# Patient Record
Sex: Female | Born: 1965 | Race: Black or African American | Hispanic: No | Smoking: Former smoker
Health system: Southern US, Community
[De-identification: ages and names within clinical notes are randomized; demographics above are authoritative.]

## PROBLEM LIST (undated history)

## (undated) DIAGNOSIS — F32A Depression, unspecified: Secondary | ICD-10-CM

## (undated) DIAGNOSIS — T7840XA Allergy, unspecified, initial encounter: Secondary | ICD-10-CM

## (undated) DIAGNOSIS — Z01419 Encounter for gynecological examination (general) (routine) without abnormal findings: Secondary | ICD-10-CM

## (undated) DIAGNOSIS — I10 Essential (primary) hypertension: Secondary | ICD-10-CM

## (undated) DIAGNOSIS — F329 Major depressive disorder, single episode, unspecified: Secondary | ICD-10-CM

## (undated) HISTORY — DX: Allergy, unspecified, initial encounter: T78.40XA

## (undated) HISTORY — DX: Depression, unspecified: F32.A

## (undated) HISTORY — PX: CARPAL TUNNEL RELEASE: SHX101

## (undated) HISTORY — PX: ABDOMINAL HYSTERECTOMY: SHX81

## (undated) HISTORY — DX: Essential (primary) hypertension: I10

## (undated) HISTORY — PX: CHOLECYSTECTOMY: SHX55

## (undated) HISTORY — DX: Encounter for gynecological examination (general) (routine) without abnormal findings: Z01.419

## (undated) HISTORY — PX: CERVICAL DISC SURGERY: SHX588

## (undated) HISTORY — DX: Major depressive disorder, single episode, unspecified: F32.9

---

## 1998-05-05 ENCOUNTER — Other Ambulatory Visit: Admission: RE | Admit: 1998-05-05 | Discharge: 1998-05-05 | Payer: Self-pay | Admitting: Family Medicine

## 1998-06-29 ENCOUNTER — Other Ambulatory Visit: Admission: RE | Admit: 1998-06-29 | Discharge: 1998-06-29 | Payer: Self-pay | Admitting: Obstetrics and Gynecology

## 1998-11-18 ENCOUNTER — Other Ambulatory Visit: Admission: RE | Admit: 1998-11-18 | Discharge: 1998-11-18 | Payer: Self-pay | Admitting: Obstetrics and Gynecology

## 1999-02-01 ENCOUNTER — Encounter: Payer: Self-pay | Admitting: Gastroenterology

## 1999-02-01 ENCOUNTER — Encounter: Admission: RE | Admit: 1999-02-01 | Discharge: 1999-02-01 | Payer: Self-pay | Admitting: Gastroenterology

## 1999-02-17 ENCOUNTER — Ambulatory Visit (HOSPITAL_COMMUNITY): Admission: RE | Admit: 1999-02-17 | Discharge: 1999-02-18 | Payer: Self-pay | Admitting: General Surgery

## 1999-02-17 ENCOUNTER — Encounter (INDEPENDENT_AMBULATORY_CARE_PROVIDER_SITE_OTHER): Payer: Self-pay

## 1999-06-10 ENCOUNTER — Other Ambulatory Visit: Admission: RE | Admit: 1999-06-10 | Discharge: 1999-06-10 | Payer: Self-pay | Admitting: Obstetrics and Gynecology

## 2000-03-28 ENCOUNTER — Encounter (INDEPENDENT_AMBULATORY_CARE_PROVIDER_SITE_OTHER): Payer: Self-pay | Admitting: Specialist

## 2000-03-28 ENCOUNTER — Ambulatory Visit (HOSPITAL_BASED_OUTPATIENT_CLINIC_OR_DEPARTMENT_OTHER): Admission: RE | Admit: 2000-03-28 | Discharge: 2000-03-28 | Payer: Self-pay | Admitting: Orthopedic Surgery

## 2000-06-16 ENCOUNTER — Other Ambulatory Visit: Admission: RE | Admit: 2000-06-16 | Discharge: 2000-06-16 | Payer: Self-pay | Admitting: Obstetrics and Gynecology

## 2000-07-28 ENCOUNTER — Other Ambulatory Visit: Admission: RE | Admit: 2000-07-28 | Discharge: 2000-07-28 | Payer: Self-pay | Admitting: Obstetrics and Gynecology

## 2000-07-28 ENCOUNTER — Encounter (INDEPENDENT_AMBULATORY_CARE_PROVIDER_SITE_OTHER): Payer: Self-pay

## 2000-08-24 ENCOUNTER — Ambulatory Visit (HOSPITAL_COMMUNITY): Admission: RE | Admit: 2000-08-24 | Discharge: 2000-08-24 | Payer: Self-pay | Admitting: Obstetrics and Gynecology

## 2000-08-24 ENCOUNTER — Encounter (INDEPENDENT_AMBULATORY_CARE_PROVIDER_SITE_OTHER): Payer: Self-pay

## 2000-11-30 ENCOUNTER — Encounter: Payer: Self-pay | Admitting: Sports Medicine

## 2000-11-30 ENCOUNTER — Encounter: Admission: RE | Admit: 2000-11-30 | Discharge: 2000-11-30 | Payer: Self-pay | Admitting: Sports Medicine

## 2000-12-14 ENCOUNTER — Other Ambulatory Visit: Admission: RE | Admit: 2000-12-14 | Discharge: 2000-12-14 | Payer: Self-pay | Admitting: Obstetrics and Gynecology

## 2001-10-26 ENCOUNTER — Other Ambulatory Visit: Admission: RE | Admit: 2001-10-26 | Discharge: 2001-10-26 | Payer: Self-pay | Admitting: Obstetrics and Gynecology

## 2002-12-06 ENCOUNTER — Other Ambulatory Visit: Admission: RE | Admit: 2002-12-06 | Discharge: 2002-12-06 | Payer: Self-pay | Admitting: Obstetrics and Gynecology

## 2002-12-27 ENCOUNTER — Encounter (INDEPENDENT_AMBULATORY_CARE_PROVIDER_SITE_OTHER): Payer: Self-pay | Admitting: *Deleted

## 2002-12-27 ENCOUNTER — Inpatient Hospital Stay (HOSPITAL_COMMUNITY): Admission: RE | Admit: 2002-12-27 | Discharge: 2002-12-30 | Payer: Self-pay | Admitting: Obstetrics and Gynecology

## 2003-03-04 ENCOUNTER — Emergency Department (HOSPITAL_COMMUNITY): Admission: EM | Admit: 2003-03-04 | Discharge: 2003-03-04 | Payer: Self-pay | Admitting: Emergency Medicine

## 2004-05-31 ENCOUNTER — Ambulatory Visit: Payer: Self-pay | Admitting: Family Medicine

## 2004-06-14 ENCOUNTER — Ambulatory Visit: Payer: Self-pay | Admitting: Family Medicine

## 2004-09-10 ENCOUNTER — Ambulatory Visit: Payer: Self-pay | Admitting: Family Medicine

## 2004-11-15 ENCOUNTER — Ambulatory Visit: Payer: Self-pay | Admitting: Family Medicine

## 2004-11-23 ENCOUNTER — Ambulatory Visit: Payer: Self-pay | Admitting: Family Medicine

## 2004-12-02 ENCOUNTER — Ambulatory Visit: Payer: Self-pay | Admitting: Family Medicine

## 2004-12-03 ENCOUNTER — Ambulatory Visit: Payer: Self-pay | Admitting: Internal Medicine

## 2004-12-10 ENCOUNTER — Ambulatory Visit: Payer: Self-pay | Admitting: Internal Medicine

## 2005-04-11 ENCOUNTER — Ambulatory Visit: Payer: Self-pay | Admitting: Family Medicine

## 2005-04-13 ENCOUNTER — Other Ambulatory Visit: Admission: RE | Admit: 2005-04-13 | Discharge: 2005-04-13 | Payer: Self-pay | Admitting: Obstetrics and Gynecology

## 2005-04-18 ENCOUNTER — Ambulatory Visit: Payer: Self-pay | Admitting: Family Medicine

## 2005-04-22 ENCOUNTER — Ambulatory Visit: Payer: Self-pay

## 2005-07-04 ENCOUNTER — Ambulatory Visit: Payer: Self-pay | Admitting: Family Medicine

## 2005-07-08 ENCOUNTER — Ambulatory Visit: Payer: Self-pay | Admitting: Family Medicine

## 2006-05-24 ENCOUNTER — Ambulatory Visit: Payer: Self-pay | Admitting: Family Medicine

## 2006-05-24 LAB — CONVERTED CEMR LAB
ALT: 15 units/L (ref 0–40)
AST: 18 units/L (ref 0–37)
Albumin: 4 g/dL (ref 3.5–5.2)
Alkaline Phosphatase: 95 units/L (ref 39–117)
BUN: 4 mg/dL — ABNORMAL LOW (ref 6–23)
Basophils Absolute: 0.1 10*3/uL (ref 0.0–0.1)
Basophils Relative: 1.2 % — ABNORMAL HIGH (ref 0.0–1.0)
Bilirubin, Direct: 0.1 mg/dL (ref 0.0–0.3)
CO2: 31 meq/L (ref 19–32)
Calcium: 9.6 mg/dL (ref 8.4–10.5)
Chloride: 104 meq/L (ref 96–112)
Cholesterol: 180 mg/dL (ref 0–200)
Creatinine, Ser: 0.7 mg/dL (ref 0.4–1.2)
Eosinophils Absolute: 0.1 10*3/uL (ref 0.0–0.6)
Eosinophils Relative: 1.8 % (ref 0.0–5.0)
GFR calc Af Amer: 119 mL/min
GFR calc non Af Amer: 99 mL/min
Glucose, Bld: 95 mg/dL (ref 70–99)
HCT: 38.4 % (ref 36.0–46.0)
HDL: 59.6 mg/dL (ref >39.0)
Hemoglobin: 13.6 g/dL (ref 12.0–15.0)
LDL Cholesterol: 102 mg/dL — ABNORMAL HIGH (ref 0–99)
Lymphocytes Relative: 33.9 % (ref 12.0–46.0)
MCHC: 35.5 g/dL (ref 30.0–36.0)
MCV: 96.8 fL (ref 78.0–100.0)
Monocytes Absolute: 0.3 10*3/uL (ref 0.2–0.7)
Monocytes Relative: 6.2 % (ref 3.0–11.0)
Neutro Abs: 2.9 10*3/uL (ref 1.4–7.7)
Neutrophils Relative %: 56.9 % (ref 43.0–77.0)
Platelets: 340 10*3/uL (ref 150–400)
Potassium: 3.9 meq/L (ref 3.5–5.1)
RBC: 3.96 M/uL (ref 3.87–5.11)
RDW: 12.5 % (ref 11.5–14.6)
Sodium: 142 meq/L (ref 135–145)
TSH: 2.02 microintl units/mL (ref 0.35–5.50)
Total Bilirubin: 0.7 mg/dL (ref 0.3–1.2)
Total CHOL/HDL Ratio: 3
Total Protein: 7 g/dL (ref 6.0–8.3)
Triglycerides: 93 mg/dL (ref 0–149)
VLDL: 19 mg/dL (ref 0–40)
WBC: 5.1 10*3/uL (ref 4.5–10.5)

## 2006-05-31 ENCOUNTER — Ambulatory Visit: Payer: Self-pay | Admitting: Family Medicine

## 2006-08-22 ENCOUNTER — Ambulatory Visit: Payer: Self-pay | Admitting: Family Medicine

## 2007-01-19 ENCOUNTER — Ambulatory Visit: Payer: Self-pay | Admitting: Family Medicine

## 2007-01-19 DIAGNOSIS — I10 Essential (primary) hypertension: Secondary | ICD-10-CM | POA: Insufficient documentation

## 2007-01-19 DIAGNOSIS — F329 Major depressive disorder, single episode, unspecified: Secondary | ICD-10-CM | POA: Insufficient documentation

## 2007-01-19 DIAGNOSIS — J309 Allergic rhinitis, unspecified: Secondary | ICD-10-CM | POA: Insufficient documentation

## 2007-01-19 DIAGNOSIS — Z9189 Other specified personal risk factors, not elsewhere classified: Secondary | ICD-10-CM | POA: Insufficient documentation

## 2007-01-19 DIAGNOSIS — J45909 Unspecified asthma, uncomplicated: Secondary | ICD-10-CM | POA: Insufficient documentation

## 2007-01-19 DIAGNOSIS — R599 Enlarged lymph nodes, unspecified: Secondary | ICD-10-CM | POA: Insufficient documentation

## 2007-01-19 DIAGNOSIS — F419 Anxiety disorder, unspecified: Secondary | ICD-10-CM | POA: Insufficient documentation

## 2007-05-02 ENCOUNTER — Telehealth: Payer: Self-pay | Admitting: Family Medicine

## 2008-03-10 ENCOUNTER — Encounter: Payer: Self-pay | Admitting: Family Medicine

## 2008-03-25 ENCOUNTER — Ambulatory Visit (HOSPITAL_COMMUNITY): Admission: RE | Admit: 2008-03-25 | Discharge: 2008-03-26 | Payer: Self-pay | Admitting: Neurological Surgery

## 2008-05-09 ENCOUNTER — Encounter: Payer: Self-pay | Admitting: Family Medicine

## 2008-08-04 ENCOUNTER — Telehealth: Payer: Self-pay | Admitting: Family Medicine

## 2008-08-07 ENCOUNTER — Ambulatory Visit: Payer: Self-pay | Admitting: Family Medicine

## 2008-08-07 LAB — CONVERTED CEMR LAB
Bilirubin Urine: NEGATIVE
Blood in Urine, dipstick: NEGATIVE
Glucose, Urine, Semiquant: NEGATIVE
Ketones, urine, test strip: NEGATIVE
Nitrite: NEGATIVE
Protein, U semiquant: NEGATIVE
Specific Gravity, Urine: 1.015
Urobilinogen, UA: 0.2
WBC Urine, dipstick: NEGATIVE
pH: 5.5

## 2008-08-08 ENCOUNTER — Encounter: Payer: Self-pay | Admitting: Family Medicine

## 2008-08-08 LAB — CONVERTED CEMR LAB
ALT: 13 units/L (ref 0–35)
AST: 18 units/L (ref 0–37)
Albumin: 4.1 g/dL (ref 3.5–5.2)
Alkaline Phosphatase: 95 units/L (ref 39–117)
BUN: 6 mg/dL (ref 6–23)
Basophils Absolute: 0 10*3/uL (ref 0.0–0.1)
Basophils Relative: 0.4 % (ref 0.0–3.0)
Bilirubin, Direct: 0 mg/dL (ref 0.0–0.3)
CO2: 29 meq/L (ref 19–32)
Calcium: 9 mg/dL (ref 8.4–10.5)
Chloride: 110 meq/L (ref 96–112)
Cholesterol: 200 mg/dL (ref 0–200)
Creatinine, Ser: 0.7 mg/dL (ref 0.4–1.2)
Eosinophils Absolute: 0.1 10*3/uL (ref 0.0–0.7)
Eosinophils Relative: 1.5 % (ref 0.0–5.0)
GFR calc non Af Amer: 117.7 mL/min (ref >60)
Glucose, Bld: 83 mg/dL (ref 70–99)
HCT: 41.6 % (ref 36.0–46.0)
HDL: 59.1 mg/dL (ref >39.00)
Hemoglobin: 14.2 g/dL (ref 12.0–15.0)
LDL Cholesterol: 125 mg/dL — ABNORMAL HIGH (ref 0–99)
Lymphocytes Relative: 36.7 % (ref 12.0–46.0)
Lymphs Abs: 1.5 10*3/uL (ref 0.7–4.0)
MCHC: 34.2 g/dL (ref 30.0–36.0)
MCV: 100.4 fL — ABNORMAL HIGH (ref 78.0–100.0)
Monocytes Absolute: 0.3 10*3/uL (ref 0.1–1.0)
Monocytes Relative: 7 % (ref 3.0–12.0)
Neutro Abs: 2.3 10*3/uL (ref 1.4–7.7)
Neutrophils Relative %: 54.4 % (ref 43.0–77.0)
Platelets: 289 10*3/uL (ref 150.0–400.0)
Potassium: 4.3 meq/L (ref 3.5–5.1)
RBC: 4.14 M/uL (ref 3.87–5.11)
RDW: 12.7 % (ref 11.5–14.6)
Sodium: 142 meq/L (ref 135–145)
TSH: 0.97 microintl units/mL (ref 0.35–5.50)
Total Bilirubin: 0.6 mg/dL (ref 0.3–1.2)
Total CHOL/HDL Ratio: 3
Total Protein: 7.3 g/dL (ref 6.0–8.3)
Triglycerides: 81 mg/dL (ref 0.0–149.0)
VLDL: 16.2 mg/dL (ref 0.0–40.0)
WBC: 4.2 10*3/uL — ABNORMAL LOW (ref 4.5–10.5)

## 2008-08-29 ENCOUNTER — Ambulatory Visit: Payer: Self-pay | Admitting: Family Medicine

## 2008-09-29 ENCOUNTER — Ambulatory Visit: Payer: Self-pay | Admitting: Family Medicine

## 2008-09-29 DIAGNOSIS — E669 Obesity, unspecified: Secondary | ICD-10-CM | POA: Insufficient documentation

## 2008-09-29 DIAGNOSIS — M549 Dorsalgia, unspecified: Secondary | ICD-10-CM | POA: Insufficient documentation

## 2008-09-29 LAB — CONVERTED CEMR LAB
Bilirubin Urine: NEGATIVE
Blood in Urine, dipstick: NEGATIVE
Glucose, Urine, Semiquant: NEGATIVE
Ketones, urine, test strip: NEGATIVE
Nitrite: NEGATIVE
Specific Gravity, Urine: <1.005
Urobilinogen, UA: 0.2
WBC Urine, dipstick: NEGATIVE
pH: 6

## 2008-11-17 ENCOUNTER — Telehealth: Payer: Self-pay | Admitting: Family Medicine

## 2009-03-10 ENCOUNTER — Telehealth: Payer: Self-pay | Admitting: Family Medicine

## 2009-04-18 ENCOUNTER — Emergency Department (HOSPITAL_COMMUNITY): Admission: EM | Admit: 2009-04-18 | Discharge: 2009-04-18 | Payer: Self-pay | Admitting: Emergency Medicine

## 2009-07-06 ENCOUNTER — Encounter: Payer: Self-pay | Admitting: Family Medicine

## 2009-09-22 ENCOUNTER — Ambulatory Visit: Payer: Self-pay | Admitting: Family Medicine

## 2009-09-22 LAB — CONVERTED CEMR LAB
Bilirubin Urine: NEGATIVE
Blood in Urine, dipstick: NEGATIVE
Glucose, Urine, Semiquant: NEGATIVE
Ketones, urine, test strip: NEGATIVE
Nitrite: NEGATIVE
Protein, U semiquant: NEGATIVE
Specific Gravity, Urine: 1.02
Urobilinogen, UA: 0.2
WBC Urine, dipstick: NEGATIVE
pH: 7

## 2009-09-23 LAB — CONVERTED CEMR LAB
ALT: 16 units/L (ref 0–35)
AST: 19 units/L (ref 0–37)
Albumin: 4.2 g/dL (ref 3.5–5.2)
Alkaline Phosphatase: 103 units/L (ref 39–117)
BUN: 8 mg/dL (ref 6–23)
Basophils Absolute: 0 10*3/uL (ref 0.0–0.1)
Basophils Relative: 0.5 % (ref 0.0–3.0)
Bilirubin, Direct: 0.1 mg/dL (ref 0.0–0.3)
CO2: 30 meq/L (ref 19–32)
Calcium: 9.2 mg/dL (ref 8.4–10.5)
Chloride: 106 meq/L (ref 96–112)
Cholesterol: 186 mg/dL (ref 0–200)
Creatinine, Ser: 0.7 mg/dL (ref 0.4–1.2)
Eosinophils Absolute: 0.1 10*3/uL (ref 0.0–0.7)
Eosinophils Relative: 1.5 % (ref 0.0–5.0)
GFR calc non Af Amer: 123.15 mL/min (ref >60)
Glucose, Bld: 82 mg/dL (ref 70–99)
HCT: 40 % (ref 36.0–46.0)
HDL: 46.6 mg/dL (ref >39.00)
Hemoglobin: 13.8 g/dL (ref 12.0–15.0)
LDL Cholesterol: 111 mg/dL — ABNORMAL HIGH (ref 0–99)
Lymphocytes Relative: 40.9 % (ref 12.0–46.0)
Lymphs Abs: 1.8 10*3/uL (ref 0.7–4.0)
MCHC: 34.4 g/dL (ref 30.0–36.0)
MCV: 99.1 fL (ref 78.0–100.0)
Monocytes Absolute: 0.3 10*3/uL (ref 0.1–1.0)
Monocytes Relative: 6.4 % (ref 3.0–12.0)
Neutro Abs: 2.2 10*3/uL (ref 1.4–7.7)
Neutrophils Relative %: 50.7 % (ref 43.0–77.0)
Platelets: 313 10*3/uL (ref 150.0–400.0)
Potassium: 4.4 meq/L (ref 3.5–5.1)
RBC: 4.04 M/uL (ref 3.87–5.11)
RDW: 13.4 % (ref 11.5–14.6)
Sodium: 139 meq/L (ref 135–145)
TSH: 1.4 microintl units/mL (ref 0.35–5.50)
Total Bilirubin: 0.3 mg/dL (ref 0.3–1.2)
Total CHOL/HDL Ratio: 4
Total Protein: 7.1 g/dL (ref 6.0–8.3)
Triglycerides: 143 mg/dL (ref 0.0–149.0)
VLDL: 28.6 mg/dL (ref 0.0–40.0)
WBC: 4.3 10*3/uL — ABNORMAL LOW (ref 4.5–10.5)

## 2009-10-01 ENCOUNTER — Ambulatory Visit: Payer: Self-pay | Admitting: Family Medicine

## 2009-10-19 ENCOUNTER — Telehealth: Payer: Self-pay | Admitting: Family Medicine

## 2010-04-01 NOTE — Assessment & Plan Note (Signed)
Summary: knot behing ear/mhf  Medications Added LOTREL 10-20 MG CAPS (AMLODIPINE BESY-BENAZEPRIL HCL) 1 by mouth once daily HYDROXYZINE HCL 25 MG TABS (HYDROXYZINE HCL) 1 by mouth once daily PROVENTIL 90 MCG/ACT  AERS (ALBUTEROL) as needed      Allergies Added: ! TYLOX  Vital Signs:  Patient Profile:   45 Years Old Female Weight:      229 pounds Temp:     98.6 degrees F oral Pulse rate:   96 / minute Pulse rhythm:   regular BP sitting:   112 / 74  (left arm) Cuff size:   large  Vitals Entered By: Alfred Levins, CMA (January 19, 2007 10:07 AM)                 Chief Complaint:  knot behind lt ear and sore.  History of Present Illness: 5 days ago a tender lump came3 up just below left earlobe. Has had some irritation behind earlobes lately. Today the lump is much smaller.  Current Allergies: ! TYLOX  Past Medical History:    Reviewed history and no changes required:       Asthma per Dr. Corinda Gubler       Allergic rhinitis       Hypertension       Depression  Past Surgical History:    Carpal tunnel release    Cholecystectomy    Hysterectomy   Family History:    Family History Diabetes 1st degree relative    Family History High cholesterol    Family History Hypertension    Family History Uterine cancer  Social History:    Single    Current Smoker    Alcohol use-yes    Drug use-no   Risk Factors:  Tobacco use:  current Drug use:  no Alcohol use:  yes   Review of Systems      See HPI   Physical Exam  General:     Well-developed,well-nourished,in no acute distress; alert,appropriate and cooperative throughout examination Skin:     slight erythema and scaling behind earlobes Cervical Nodes:     tiny tender subcutaneous lump below left ear    Impression & Recommendations:  Problem # 1:  ENLARGEMENT OF LYMPH NODES (ICD-785.6) appears to be a single node reacting to the eczema behind the earlobe.  Complete Medication List: 1)  Lotrel 10-20 Mg  Caps (Amlodipine besy-benazepril hcl) .Marland Kitchen.. 1 by mouth once daily 2)  Hydroxyzine Hcl 25 Mg Tabs (Hydroxyzine hcl) .Marland Kitchen.. 1 by mouth once daily 3)  Proventil 90 Mcg/act Aers (Albuterol) .... As needed   Patient Instructions: 1)  Should be temporary and resolve soon. Try Neosporin behind earlobes. 2)  Please schedule a follow-up appointment as needed.    ]

## 2010-04-01 NOTE — Letter (Signed)
Summary: [facility]  [facility]   Imported By: Maryln Gottron 05/28/2008 15:08:22  _____________________________________________________________________  External Attachment:    Type:   Image     Comment:   External Document

## 2010-04-01 NOTE — Progress Notes (Signed)
Summary: RF  Phone Note Call from Patient Call back at [phone]   Caller: Patient-LIVE CALL Reason for Call: Refill Medication Summary of Call: RF LOTREL AT [facility] AT [phone]. HER CPX IS ON 08-29-2008 Initial call taken by: Warnell Forester,  August 04, 2008 8:07 AM  Follow-up for Phone Call        Phone Call Completed, Rx Called In Follow-up by: Alfred Levins, CMA,  August 04, 2008 8:23 AM      Prescriptions: LOTREL 10-20 MG CAPS (AMLODIPINE BESY-BENAZEPRIL HCL) 1 by mouth once daily  #30 x 0   Entered by:   Alfred Levins, CMA   Authorized by:   Nelwyn Salisbury MD   Signed by:   Alfred Levins, CMA on 08/04/2008   Method used:   Electronically to        [facility]  [address] (retail)       [address]       [place], [place]  [address]       Ph: [phone]       Fax: [phone]   RxID:   [id number]

## 2010-04-01 NOTE — Progress Notes (Signed)
Summary: refill phentermine  Phone Note From Pharmacy   Caller: [facility]  [address] Call For: Sarely Stracener  Summary of Call: refill phentermine 37.5mg  1 by mouth once daily Initial call taken by: Alfred Levins, CMA,  March 10, 2009 11:20 AM  Follow-up for Phone Call        call in #30 with 5 rf Follow-up by: Nelwyn Salisbury MD,  March 10, 2009 11:52 AM  Additional Follow-up for Phone Call Additional follow up Details #1::        Phone call completed, Pharmacist called Additional Follow-up by: Alfred Levins, CMA,  March 10, 2009 12:09 PM    Prescriptions: PHENTERMINE HCL 37.5 MG CAPS (PHENTERMINE HCL) once daily  #30 x 5   Entered by:   Alfred Levins, CMA   Authorized by:   Nelwyn Salisbury MD   Signed by:   Alfred Levins, CMA on 03/10/2009   Method used:   Telephoned to ...       [facility]  [address] [address] (retail)       [address]       [place], [place]  [address]       Ph: [phone]       Fax: [phone]   RxID:   [id number]

## 2010-04-01 NOTE — Letter (Signed)
Summary: Lipid Letter  [facility]  [address] [place], [place] [address]   Phone: [phone]  Fax: [phone]    08/08/2008  Vanessa Hawkins [address] [place], [place]  [address]  Dear Suzette Battiest:  We have carefully reviewed your last lipid profile from 08/07/2008 and the results are noted below with a summary of recommendations for lipid management.    Cholesterol:       200     Goal: <   HDL "good" Cholesterol:   62.95     Goal: >   LDL "bad" Cholesterol:   125     Goal: <   Triglycerides:       81.0     Goal: <    Labs are normal.    TLC Diet (Therapeutic Lifestyle Change): Saturated Fats & Transfatty acids should be kept < 7% of total calories ***Reduce Saturated Fats Polyunstaurated Fat can be up to 10% of total calories Monounsaturated Fat Fat can be up to 20% of total calories Total Fat should be no greater than 25-35% of total calories Carbohydrates should be 50-60% of total calories Protein should be approximately 15% of total calories Fiber should be at least 20-30 grams a day ***Increased fiber may help lower LDL Total Cholesterol should be < 200mg /day Consider adding plant stanol/sterols to diet (example: Benacol spread) ***A higher intake of unsaturated fat may reduce Triglycerides and Increase HDL    Adjunctive Measures (may lower LIPIDS and reduce risk of Heart Attack) include: Aerobic Exercise (20-30 minutes 3-4 times a week) Limit Alcohol Consumption Weight Reduction Aspirin 75-81 mg a day by mouth (if not allergic or contraindicated) Dietary Fiber 20-30 grams a day by mouth     Current Medications: 1)    Lotrel 10-20 Mg Caps (Amlodipine besy-benazepril hcl) .Marland Kitchen.. 1 by mouth once daily 2)    Hydroxyzine Hcl 25 Mg Tabs (Hydroxyzine hcl) .Marland Kitchen.. 1 by mouth once daily 3)    Proventil 90 Mcg/act  Aers (Albuterol) .... As needed 4)    Chantix Starting Month Pak 0.5 Mg X 11 & 1 Mg X 42  Misc (Varenicline tartrate) .... 0.5mg  by mouth once  daily for 3 days, then twice daily for 4 days and then 1mg  by mouth 2 times daily  If you have any questions, please call. We appreciate being able to work with you.   Sincerely,    [place] at [facility] MD

## 2010-04-01 NOTE — Letter (Signed)
Summary: [facility]  [facility]   Imported By: Maryln Gottron 04/04/2008 15:23:25  _____________________________________________________________________  External Attachment:    Type:   Image     Comment:   External Document

## 2010-04-01 NOTE — Letter (Signed)
Summary: [facility]  [facility]   Imported By: Maryln Gottron 07/21/2009 15:02:40  _____________________________________________________________________  External Attachment:    Type:   Image     Comment:   External Document

## 2010-04-01 NOTE — Assessment & Plan Note (Signed)
Summary: CPX/NO PAP/NJR PT RSC/NJR   Vital Signs:  Patient profile:   45 year old female Height:      68 inches Weight:      224 pounds BMI:     34.18 Temp:     98.0 degrees F oral Pulse rate:   101 / minute BP sitting:   124 / 82  (left arm) Cuff size:   large  Vitals Entered By: Alfred Levins, CMA (August 29, 2008 8:55 AM) CC: cpx, no pap   History of Present Illness: 45 yr old female for cpx. Doing well except she would like some help in losing weight. is exercising but would like to try an appetite suppressant also. Still has some soreness in the neck after her disc surgery, but this is getting better.   Allergies: 1)  ! Tylox  Past History:  Past Medical History: Asthma per Dr. Corinda Gubler Allergic rhinitis Hypertension Depression sees Dr. Duane Lope for GYN exams  Past Surgical History: Carpal tunnel release Cholecystectomy Hysterectomy C4-5 disc surgery 1-10 per Dr. Danielle Dess  Family History: Reviewed history from 01/19/2007 and no changes required. Family History Diabetes 1st degree relative Family History High cholesterol Family History Hypertension Family History Uterine cancer  Social History: Reviewed history from 01/19/2007 and no changes required. Single Current Smoker Alcohol use-yes Drug use-no  Review of Systems  The patient denies anorexia, fever, weight loss, weight gain, vision loss, decreased hearing, hoarseness, chest pain, syncope, dyspnea on exertion, peripheral edema, prolonged cough, headaches, hemoptysis, abdominal pain, melena, hematochezia, severe indigestion/heartburn, hematuria, incontinence, genital sores, muscle weakness, suspicious skin lesions, transient blindness, difficulty walking, depression, unusual weight change, abnormal bleeding, enlarged lymph nodes, angioedema, breast masses, and testicular masses.    Physical Exam  General:  overweight-appearing.   Head:  Normocephalic and atraumatic without obvious abnormalities. No  apparent alopecia or balding. Eyes:  No corneal or conjunctival inflammation noted. EOMI. Perrla. Funduscopic exam benign, without hemorrhages, exudates or papilledema. Vision grossly normal. Ears:  External ear exam shows no significant lesions or deformities.  Otoscopic examination reveals clear canals, tympanic membranes are intact bilaterally without bulging, retraction, inflammation or discharge. Hearing is grossly normal bilaterally. Nose:  External nasal examination shows no deformity or inflammation. Nasal mucosa are pink and moist without lesions or exudates. Mouth:  Oral mucosa and oropharynx without lesions or exudates.  Teeth in good repair. Neck:  No deformities, masses, or tenderness noted. Chest Wall:  No deformities, masses, or tenderness noted. Lungs:  Normal respiratory effort, chest expands symmetrically. Lungs are clear to auscultation, no crackles or wheezes. Heart:  Normal rate and regular rhythm. S1 and S2 normal without gallop, murmur, click, rub or other extra sounds. Abdomen:  Bowel sounds positive,abdomen soft and non-tender without masses, organomegaly or hernias noted. Msk:  No deformity or scoliosis noted of thoracic or lumbar spine.   Pulses:  R and L carotid,radial,femoral,dorsalis pedis and posterior tibial pulses are full and equal bilaterally Extremities:  No clubbing, cyanosis, edema, or deformity noted with normal full range of motion of all joints.   Neurologic:  No cranial nerve deficits noted. Station and gait are normal. Plantar reflexes are down-going bilaterally. DTRs are symmetrical throughout. Sensory, motor and coordinative functions appear intact. Skin:  Intact without suspicious lesions or rashes Cervical Nodes:  No lymphadenopathy noted Axillary Nodes:  No palpable lymphadenopathy Inguinal Nodes:  No significant adenopathy Psych:  Cognition and judgment appear intact. Alert and cooperative with normal attention span and concentration. No apparent  delusions,  illusions, hallucinations   Impression & Recommendations:  Problem # 1:  HEALTH MAINTENANCE EXAM (ICD-V70.0)  Complete Medication List: 1)  Lotrel 10-20 Mg Caps (Amlodipine besy-benazepril hcl) .Marland Kitchen.. 1 by mouth once daily 2)  Hydroxyzine Hcl 25 Mg Tabs (Hydroxyzine hcl) .Marland Kitchen.. 1 by mouth once daily 3)  Proventil Hfa 108 (90 Base) Mcg/act Aers (Albuterol sulfate) .... 2 puffs q  4 hours as needed 4)  Phentermine Hcl 37.5 Mg Caps (Phentermine hcl) .... Once daily  Patient Instructions: 1)  try phentermine. Exercise.  2)  Please schedule a follow-up appointment in 1 month.  Prescriptions: PHENTERMINE HCL 37.5 MG CAPS (PHENTERMINE HCL) once daily  #30 x 5   Entered and Authorized by:   Nelwyn Salisbury MD   Signed by:   Nelwyn Salisbury MD on 08/29/2008   Method used:   Print then Give to Patient   RxID:   [id number] PROVENTIL HFA 108 (90 BASE) MCG/ACT AERS (ALBUTEROL SULFATE) 2 puffs q  4 hours as needed  #1 x 5   Entered and Authorized by:   Nelwyn Salisbury MD   Signed by:   Nelwyn Salisbury MD on 08/29/2008   Method used:   Print then Give to Patient   RxID:   [id number] HYDROXYZINE HCL 25 MG TABS (HYDROXYZINE HCL) 1 by mouth once daily  #30 x 11   Entered and Authorized by:   Nelwyn Salisbury MD   Signed by:   Nelwyn Salisbury MD on 08/29/2008   Method used:   Print then Give to Patient   RxID:   [id number] LOTREL 10-20 MG CAPS (AMLODIPINE BESY-BENAZEPRIL HCL) 1 by mouth once daily  #30 x 11   Entered and Authorized by:   Nelwyn Salisbury MD   Signed by:   Nelwyn Salisbury MD on 08/29/2008   Method used:   Print then Give to Patient   RxID:   [id number]

## 2010-04-01 NOTE — Progress Notes (Signed)
Summary: Phentermine  Phone Note Call from Patient   Caller: Patient Call For: Nelwyn Salisbury MD Summary of Call: Pt is asking for Phentermine.  [facility] Initial call taken by: Lynann Beaver CMA,  October 19, 2009 12:10 PM  Follow-up for Phone Call        call in 37.5 mg once daily , #30 with 5 rf Follow-up by: Nelwyn Salisbury MD,  October 19, 2009 1:15 PM    New/Updated Medications: PHENTERMINE HCL 37.5 MG CAPS (PHENTERMINE HCL) 1 once daily Prescriptions: PHENTERMINE HCL 37.5 MG CAPS (PHENTERMINE HCL) 1 once daily  #30 x 5   Entered by:   Raechel Ache, RN   Authorized by:   Nelwyn Salisbury MD   Signed by:   Raechel Ache, RN on 10/19/2009   Method used:   Printed then faxed to ...       [facility]  [address] (retail)       [address]       [place], [place]  [address]       Ph: [phone]       Fax: [phone]   RxID:   [id number]

## 2010-04-01 NOTE — Assessment & Plan Note (Signed)
Summary: ROA/FUP/RCD rsc with pt/mhf   Vital Signs:  Patient profile:   45 year old female Weight:      224 pounds Temp:     98.1 degrees F oral Pulse rate:   80 / minute BP sitting:   110 / 70  (left arm) Cuff size:   large  Vitals Entered By: Army Fossa CMA (September 29, 2008 3:39 PM) CC: follow up on phentermine. has had a pain in side x 2 weeks    History of Present Illness: Here to recheck after starting to take Phentermine a month ago. She has tolerated it well, although she does describe a dull continuous ache in the right midback which started about 2 weeks ago. THis has actually gotten much better, and today it does not bother her at all. No fever or nausea. No urinary symptoms.   Allergies: 1)  ! Tylox  Past History:  Past Medical History: Reviewed history from 08/29/2008 and no changes required. Asthma per Dr. Corinda Gubler Allergic rhinitis Hypertension Depression sees Dr. Duane Lope for GYN exams  Past Surgical History: Reviewed history from 08/29/2008 and no changes required. Carpal tunnel release Cholecystectomy Hysterectomy C4-5 disc surgery 1-10 per Dr. Danielle Dess  Review of Systems  The patient denies anorexia, fever, weight loss, weight gain, vision loss, decreased hearing, hoarseness, chest pain, syncope, dyspnea on exertion, peripheral edema, prolonged cough, headaches, hemoptysis, abdominal pain, melena, hematochezia, severe indigestion/heartburn, hematuria, incontinence, genital sores, muscle weakness, suspicious skin lesions, transient blindness, difficulty walking, depression, unusual weight change, abnormal bleeding, enlarged lymph nodes, angioedema, breast masses, and testicular masses.    Physical Exam  General:  overweight-appearing.  Her weight is unchanged from last visit.  Lungs:  Normal respiratory effort, chest expands symmetrically. Lungs are clear to auscultation, no crackles or wheezes. Heart:  Normal rate and regular rhythm. S1 and S2  normal without gallop, murmur, click, rub or other extra sounds. Abdomen:  Bowel sounds positive,abdomen soft and non-tender without masses, organomegaly or hernias noted. Msk:  No deformity or scoliosis noted of thoracic or lumbar spine.  No tenderness.    Impression & Recommendations:  Problem # 1:  BACK PAIN (ICD-724.5)  Orders: UA Dipstick w/o Micro (manual) ([address])  Problem # 2:  OBESITY, UNSPECIFIED (ICD-278.00)  Complete Medication List: 1)  Lotrel 10-20 Mg Caps (Amlodipine besy-benazepril hcl) .Marland Kitchen.. 1 by mouth once daily 2)  Hydroxyzine Hcl 25 Mg Tabs (Hydroxyzine hcl) .Marland Kitchen.. 1 by mouth once daily 3)  Proventil Hfa 108 (90 Base) Mcg/act Aers (Albuterol sulfate) .... 2 puffs q  4 hours as needed 4)  Phentermine Hcl 37.5 Mg Caps (Phentermine hcl) .... Once daily  Patient Instructions: 1)  Please schedule a follow-up appointment in 6 months .   Laboratory Results   Urine Tests    Routine Urinalysis   Color: yellow Appearance: Clear Glucose: negative   (Normal Range: Negative) Bilirubin: negative   (Normal Range: Negative) Ketone: negative   (Normal Range: Negative) Spec. Gravity: <1.005   (Normal Range: 1.003-1.035) Blood: negative   (Normal Range: Negative) pH: 6.0   (Normal Range: 5.0-8.0) Protein: trace   (Normal Range: Negative) Urobilinogen: 0.2   (Normal Range: 0-1) Nitrite: negative   (Normal Range: Negative) Leukocyte Esterace: negative   (Normal Range: Negative)    Comments: Army Fossa CMA  September 29, 2008 4:26 PM

## 2010-04-01 NOTE — Assessment & Plan Note (Signed)
Summary: CPX/CJR   Vital Signs:  Patient profile:   45 year old female Weight:      225 pounds BMI:     34.33 BP sitting:   100 / 70  (left arm) Cuff size:   large  Vitals Entered By: Raechel Ache, RN (October 01, 2009 10:14 AM) CC: CPX, labs done. Sees gyn.   History of Present Illness: 45 yr old female for a cpx. She feels fine and has no concerns. She admits to letting her diet slip and not exercising over the past year, and this has led to some weight gain. She is now determined to get the weight back down.   Allergies: 1)  ! Tylox  Past History:  Past Medical History: Asthma  Allergic rhinitis Hypertension Depression sees Dr. Duane Lope for GYN exams  Past Surgical History: Reviewed history from 08/29/2008 and no changes required. Carpal tunnel release Cholecystectomy Hysterectomy C4-5 disc surgery 1-10 per Dr. Danielle Dess  Family History: Reviewed history from 01/19/2007 and no changes required. Family History Diabetes 1st degree relative Family History High cholesterol Family History Hypertension Family History Uterine cancer  Social History: Reviewed history from 01/19/2007 and no changes required. Single Current Smoker Alcohol use-yes Drug use-no  Review of Systems  The patient denies anorexia, fever, weight loss, vision loss, decreased hearing, hoarseness, chest pain, syncope, dyspnea on exertion, peripheral edema, prolonged cough, headaches, hemoptysis, abdominal pain, melena, hematochezia, severe indigestion/heartburn, hematuria, incontinence, genital sores, muscle weakness, suspicious skin lesions, transient blindness, difficulty walking, depression, unusual weight change, abnormal bleeding, enlarged lymph nodes, angioedema, breast masses, and testicular masses.    Physical Exam  General:  overweight-appearing.   Head:  Normocephalic and atraumatic without obvious abnormalities. No apparent alopecia or balding. Eyes:  No corneal or conjunctival  inflammation noted. EOMI. Perrla. Funduscopic exam benign, without hemorrhages, exudates or papilledema. Vision grossly normal. Ears:  External ear exam shows no significant lesions or deformities.  Otoscopic examination reveals clear canals, tympanic membranes are intact bilaterally without bulging, retraction, inflammation or discharge. Hearing is grossly normal bilaterally. Nose:  External nasal examination shows no deformity or inflammation. Nasal mucosa are pink and moist without lesions or exudates. Mouth:  Oral mucosa and oropharynx without lesions or exudates.  Teeth in good repair. Neck:  No deformities, masses, or tenderness noted. Chest Wall:  No deformities, masses, or tenderness noted. Lungs:  Normal respiratory effort, chest expands symmetrically. Lungs are clear to auscultation, no crackles or wheezes. Heart:  Normal rate and regular rhythm. S1 and S2 normal without gallop, murmur, click, rub or other extra sounds. Abdomen:  Bowel sounds positive,abdomen soft and non-tender without masses, organomegaly or hernias noted. Msk:  No deformity or scoliosis noted of thoracic or lumbar spine.   Pulses:  R and L carotid,radial,femoral,dorsalis pedis and posterior tibial pulses are full and equal bilaterally Extremities:  No clubbing, cyanosis, edema, or deformity noted with normal full range of motion of all joints.   Neurologic:  No cranial nerve deficits noted. Station and gait are normal. Plantar reflexes are down-going bilaterally. DTRs are symmetrical throughout. Sensory, motor and coordinative functions appear intact. Skin:  Intact without suspicious lesions or rashes Cervical Nodes:  No lymphadenopathy noted Axillary Nodes:  No palpable lymphadenopathy Inguinal Nodes:  No significant adenopathy Psych:  Cognition and judgment appear intact. Alert and cooperative with normal attention span and concentration. No apparent delusions, illusions, hallucinations   Impression &  Recommendations:  Problem # 1:  HEALTH MAINTENANCE EXAM (ICD-V70.0)  Complete  Medication List: 1)  Lotrel 10-20 Mg Caps (Amlodipine besy-benazepril hcl) .Marland Kitchen.. 1 by mouth once daily 2)  Hydroxyzine Hcl 25 Mg Tabs (Hydroxyzine hcl) .Marland Kitchen.. 1 by mouth three times a day prn 3)  Proventil Hfa 108 (90 Base) Mcg/act Aers (Albuterol sulfate) .... 2 puffs q  4 hours as needed  Patient Instructions: 1)  Please schedule a follow-up appointment in 6 months .  2)  It is important that you exercise reguarly at least 20 minutes 5 times a week. If you develop chest pain, have severe difficulty breathing, or feel very tired, stop exercising immediately and seek medical attention.  3)  You need to lose weight. Consider a lower calorie diet and regular exercise.  Prescriptions: PROVENTIL HFA 108 (90 BASE) MCG/ACT AERS (ALBUTEROL SULFATE) 2 puffs q  4 hours as needed  #1 x 5   Entered and Authorized by:   Nelwyn Salisbury MD   Signed by:   Nelwyn Salisbury MD on 10/01/2009   Method used:   Electronically to        [facility] ([facility]ail)       [address]       [place], [place]  [address]       Ph: [phone]       Fax: [phone]   RxID:   [id number] HYDROXYZINE HCL 25 MG TABS (HYDROXYZINE HCL) 1 by mouth three times a day prn  #90 x 11   Entered and Authorized by:   Nelwyn Salisbury MD   Signed by:   Nelwyn Salisbury MD on 10/01/2009   Method used:   Electronically to        [facility]  [address] [address] ([facility]ail)       [address]       [place], [place]  [address]       Ph: [phone]       Fax: [phone]   RxID:   [id number] LOTREL 10-20 MG CAPS (AMLODIPINE BESY-BENAZEPRIL HCL) 1 by mouth once daily  #30 x 11   Entered and Authorized by:   Nelwyn Salisbury MD   Signed by:   Nelwyn Salisbury MD on 10/01/2009   Method used:   Electronically to        [facility] [address]ail[facility]       [address]       [place], [place]  [address]       Ph:  [phone]       Fax: [phone]   RxID:   [id number]

## 2010-05-21 ENCOUNTER — Encounter: Payer: Self-pay | Admitting: Family Medicine

## 2010-05-26 ENCOUNTER — Encounter: Payer: Self-pay | Admitting: Family Medicine

## 2010-05-26 ENCOUNTER — Ambulatory Visit (INDEPENDENT_AMBULATORY_CARE_PROVIDER_SITE_OTHER): Payer: 59 | Admitting: Family Medicine

## 2010-05-26 VITALS — BP 120/84 | HR 86 | Temp 98.2°F | Wt 229.0 lb

## 2010-05-26 DIAGNOSIS — F32A Depression, unspecified: Secondary | ICD-10-CM

## 2010-05-26 DIAGNOSIS — F329 Major depressive disorder, single episode, unspecified: Secondary | ICD-10-CM

## 2010-05-26 MED ORDER — SERTRALINE HCL 50 MG PO TABS: 50.0000 mg | ORAL_TABLET | Freq: Every day | ORAL | Status: DC

## 2010-05-26 NOTE — Progress Notes (Signed)
  Subjective:    Patient ID: Vanessa Hawkins, female    DOB: 03/30/1965, 45 y.o.   MRN: [id number]  HPI Here to discuss some stress problems she has been having. She was treated for depression a few years ago with Wellbutrin, and she did well on this, After taking it for about 2 years, she was able to wean off it. Then one year ago her GYN gave her some Xanax to help with sleep , and she took this off and on for a few months. She has had a rough winter due to her mother's health problems, and her mother died from ovarian cancer in Mar 27, 2022. Lately she has been depressed, feeling sad, tearful, and her sleep has been poor. She tends to isolate herself form friends and family. She is tired all the time and unmotivated. She denies any suicidal thoughts. She had seen Berniece Andreas for therapy last year, but not for some time now.    Review of Systems  Constitutional: Positive for fever and fatigue. Negative for activity change and appetite change.  Psychiatric/Behavioral: Positive for sleep disturbance, dysphoric mood and decreased concentration. Negative for suicidal ideas, hallucinations, behavioral problems, confusion and agitation. The patient is nervous/anxious. The patient is not hyperactive.        Objective:   Physical Exam  Constitutional: She appears well-developed and well-nourished.  Psychiatric: Her behavior is normal. Judgment and thought content normal.       Affect is depressed, she is tearful           Assessment & Plan:  Start on Zoloft daily , and see me in 3 weeks. Suggested she start seeing Berniece Andreas again soon.

## 2010-06-14 LAB — BASIC METABOLIC PANEL
BUN: 4 mg/dL — ABNORMAL LOW (ref 6–23)
CO2: 26 mEq/L (ref 19–32)
Calcium: 9.1 mg/dL (ref 8.4–10.5)
Chloride: 102 mEq/L (ref 96–112)
Creatinine, Ser: 0.68 mg/dL (ref 0.4–1.2)
GFR calc Af Amer: >60 mL/min (ref >60)
GFR calc non Af Amer: >60 mL/min (ref >60)
Glucose, Bld: 91 mg/dL (ref 70–99)
Potassium: 4.1 mEq/L (ref 3.5–5.1)
Sodium: 136 mEq/L (ref 135–145)

## 2010-06-14 LAB — CBC
HCT: 39.2 % (ref 36.0–46.0)
Hemoglobin: 13.2 g/dL (ref 12.0–15.0)
MCHC: 33.7 g/dL (ref 30.0–36.0)
MCV: 98.2 fL (ref 78.0–100.0)
Platelets: 306 10*3/uL (ref 150–400)
RBC: 3.99 MIL/uL (ref 3.87–5.11)
RDW: 13.1 % (ref 11.5–15.5)
WBC: 5.2 10*3/uL (ref 4.0–10.5)

## 2010-06-18 ENCOUNTER — Encounter: Payer: Self-pay | Admitting: Family Medicine

## 2010-06-18 ENCOUNTER — Ambulatory Visit (INDEPENDENT_AMBULATORY_CARE_PROVIDER_SITE_OTHER): Payer: 59 | Admitting: Family Medicine

## 2010-06-18 VITALS — BP 120/82 | HR 99 | Temp 98.3°F | Wt 240.0 lb

## 2010-06-18 DIAGNOSIS — F32A Depression, unspecified: Secondary | ICD-10-CM

## 2010-06-18 DIAGNOSIS — F329 Major depressive disorder, single episode, unspecified: Secondary | ICD-10-CM

## 2010-06-18 DIAGNOSIS — G4700 Insomnia, unspecified: Secondary | ICD-10-CM

## 2010-06-18 MED ORDER — TRAZODONE HCL 50 MG PO TABS: 50.0000 mg | ORAL_TABLET | Freq: Every day | ORAL | Status: DC

## 2010-06-18 NOTE — Progress Notes (Signed)
  Subjective:    Patient ID: Vanessa Hawkins, female    DOB: [birth year], 45 y.o.   MRN: [id number]  HPI Here to follow up on depression. She is pleased with Zoloft, and she wants to stay on it. She feels better with less  depression and a bit more energy. She is going to therapy sessions. She sleeps well with Xanax, but she would like to use something else that is less habit forming.    Review of Systems  Constitutional: Negative.   Psychiatric/Behavioral: Positive for sleep disturbance, dysphoric mood and decreased concentration. Negative for suicidal ideas, behavioral problems and confusion. The patient is not nervous/anxious.        Objective:   Physical Exam  Constitutional: She appears well-developed and well-nourished.  Psychiatric: She has a normal mood and affect. Her behavior is normal. Judgment and thought content normal.          Assessment & Plan:  She is improving. Stay on Zoloft at 50 mg a day and try Trazodone for sleep. Continue therapy.

## 2010-06-22 ENCOUNTER — Telehealth: Payer: Self-pay | Admitting: *Deleted

## 2010-06-22 NOTE — Telephone Encounter (Signed)
Stop Trazadone, and instead start on Temazepam 30 mg qhs. Call in #30 with 5 rf

## 2010-06-22 NOTE — Telephone Encounter (Signed)
Trazadone 50 mg or 100 mg is not working for pt.   Wants a different RX.  [facility])

## 2010-06-22 NOTE — Telephone Encounter (Signed)
Stop the Trazodone, and instead call in

## 2010-06-23 MED ORDER — TEMAZEPAM 30 MG PO CAPS: 30.0000 mg | ORAL_CAPSULE | Freq: Every evening | ORAL | Status: DC | PRN

## 2010-06-23 NOTE — Telephone Encounter (Signed)
rx called in, cancelled the trazodone and told pt to d/c the trazodone, pt aware

## 2010-07-13 NOTE — Op Note (Signed)
Vanessa, Hawkins NO.:  [id number]   MEDICAL RECORD NO.:  [id number]          PATIENT TYPE:  OIB   LOCATION:  3526                         FACILITY:  [facility]   PHYSICIAN:  Stefani Dama, M.D.  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   OPERATION:  Anterior cervical decompression arthrodesis, C4-C5 with  structural allograft and Alphatec plate fixation.   SURGEON:  Stefani Dama, MD   FIRST ASSISTANT:  Cristi Loron, MD   ANESTHESIA:  General endotracheal.   INDICATIONS:  [name] is a 45 year old individual who has  had significant neck, shoulder, and left arm pain with weakness in the  deltoid.  She has evidence of a large extruded fragment of disk at C4-  C5, but there is marked degeneration of the disk space at C4-C5.  It has  been advised regarding anterior decompression arthrodesis.   PROCEDURE:  The patient was brought to the operating room and placed on  the table in supine position.  After smooth induction of general  endotracheal anesthesia, she was placed in 5 pounds halter traction.  Neck was prepped with alcohol and DuraPrep and draped in sterile  fashion.  A transverse incision was created on the left side of neck and  this was carried down through the platysma.  Plane between the  sternocleidomastoid and strap muscles were dissected bluntly until the  prevertebral space was reached.  First identifiable disk space was noted  to be that of C3-C4 on localizing radiograph and a C4-C5 and anterior  opening into the disk space was made with a #15 blade.  A self-retaining  Caspar retractor was placed in the wound under longus coli  muscle and  then a diskectomy was carried out removing the entirety of the disk at  C4-C5.  On the left side, it was noted to be herniation through the  ligament and there was free fragments of disk in the region of the  foramen on the left side of C4-C5 nerve root.  This was decompressed in  a piecemeal fashion.  Ultimately, some epidural bleeding was  encountered.  This was controlled with a bipolar cautery and some small  pledgets of Gelfoam, which was ultimately removed.  In the end, the  common dural tube and a pathway of the C5 nerve root on the left side  and right side was cleared.  The endplates were then drilled smooth and  then 8-mm trans graft was cut to appropriate size and shape and  configuration to fit within the interspace.  This was filled with  demineralized bone matrix and then tamped into the space, countersunk  approximately a millimeter.  Traction was removed at this point and 14-  mm standard size Alphatec plate was fitted to the ventral aspect of  vertebral bodies with 4 variable angled 14-mm screws.  These were locked  into position.  The ventral soft tissues were then checked for  hemostasis.  The wound was irrigated copiously with antibiotic  irrigating solution and then the platysma muscle was closed 3-0 Vicryl  in interrupted fashion, 3-0 Vicryl was used in the subcuticular tissues.  Dermabond was placed on the skin.  The patient tolerated the procedure  well and was returned to recovery room in stable condition.      Stefani Dama, M.D.  Electronically Signed     HJE/MEDQ  D:  03/25/2008  T:  03/26/2008  Job:  [id number]

## 2010-07-16 ENCOUNTER — Encounter: Payer: Self-pay | Admitting: Family Medicine

## 2010-07-16 ENCOUNTER — Ambulatory Visit (INDEPENDENT_AMBULATORY_CARE_PROVIDER_SITE_OTHER): Payer: 59 | Admitting: Family Medicine

## 2010-07-16 VITALS — BP 118/74 | HR 93 | Temp 99.0°F | Wt 238.0 lb

## 2010-07-16 DIAGNOSIS — F329 Major depressive disorder, single episode, unspecified: Secondary | ICD-10-CM

## 2010-07-16 DIAGNOSIS — F32A Depression, unspecified: Secondary | ICD-10-CM

## 2010-07-16 DIAGNOSIS — G4700 Insomnia, unspecified: Secondary | ICD-10-CM

## 2010-07-16 MED ORDER — SERTRALINE HCL 50 MG PO TABS: 50.0000 mg | ORAL_TABLET | Freq: Every day | ORAL | Status: DC

## 2010-07-16 NOTE — Op Note (Signed)
[facility]  Patient:    Vanessa Hawkins, Vanessa Hawkins                   MRN: [id number] Proc. Date: 08/24/00 Adm. Date:  [id number] Attending:  Miguel Aschoff                           Operative Report  PREOPERATIVE DIAGNOSIS:       Cervical interepithelial neoplasia.  POSTOPERATIVE DIAGNOSIS:      Cervical interepithelial neoplasia.  PROCEDURE:                    Loop electrocautery excision procedure.  SURGEON:                      Miguel Aschoff, M.D.  ANESTHESIA:                   IV sedation with paracervical block.  COMPLICATIONS:                None.  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  The patient underwent colposcopy and biopsies and no definite dysplasia was identified.  In view of the Pap smear report and the concern about still existent lesion, she is undergoing a LEEP procedure for wider excision of this area to be assessed pathologically.  DESCRIPTION OF PROCEDURE:     The patient was taken to the operating room and placed in the supine position.  IV sedation was administered without difficulty.  She was then placed in the dorsal lithotomy position, prepped and draped in the usual sterile fashion.  Speculum was placed in the vaginal vault and then the cervix was injected with 18 cc of 1% Xylocaine with epinephrine by placing 6 cc at the 12, 4 and 8 oclock positions.  After this was done, using a large loop electrode with 90 watts of power, a cone of tissue was cut free from the cervix without difficulty and then ______ at the 12 oclock position for identification.  The defect then created by this was cauterized with electrocautery and hemostasis was readily achieved.  The specimen was sent for histologic study.  The patient tolerated the procedure well.  The estimated blood loss was less than 10 cc.  PLAN:  The  patient will be discharged home.  Medications for home include Darvocet-N 100 one q.4h. as needed for pain and doxycycline one twice a day for three days.  She is to call for pathology report on July 5th.  She is to call for any problems such as fever, pain or heavy bleeding.  She will be seen back in four weeks for follow-up examination. DD:  08/24/00 TD:  08/24/00 Job: [id number]

## 2010-07-16 NOTE — Assessment & Plan Note (Signed)
[facility] OFFICE NOTE   NAME:SHELTONKallista, Pae                   MRN:          [id number]  DATE:05/31/2006                            DOB:          [birth year]    This is a 45 year old woman here for a non-gynecological physical  examination.  In general, she is doing well and has no complaints at  all.  She is asking for help in quitting smoking, however.  She has  tried various nicotine products and has not been successful.  A friend  of hers was successful using Chantix, and she would like to try it.  Otherwise, her asthma is doing quite well.  She never has to use her  albuterol more than once or twice a week.  She has been exercising and  dieting, and has been able to lose about 20 pounds over the past year.  She continues to see Dr. Tenny Craw for gynecology exams.  Of note, in  February of 2007, I sent her for a cardiac stress test due to some T  wave inversions seen on her resting EKG.  This was completely within  normal limits.  Further details of her past medical history, family  history, social history, habits, Melvern Banker, I refer you to our last  physical note dated April 18, 2005.   ALLERGIES:  TYLOX.   CURRENT MEDICATIONS:  1. Hydroxyzine 25 mg t.i.d.  2. Lotrel 10/20 once a day.  3. Albuterol inhalers as needed.   OBJECTIVE:  Height 5 feet 8 inches, weight 209, BP 124/80, pulse 80 and  regular.  GENERAL:  She remains a little overweight.  SKIN:  Clear.  HEENT:  Eyes clear.  Sclerae and pharynx clear.  NECK:  Supple without lymphadenopathy or masses.  LUNGS:  Clear with good air flow.  CARDIAC:  Rate and rhythm regular without gallops, murmurs, or rubs.  Distal pulses full.  ABDOMEN:  Soft, normal bowel sounds.  Nontender.  No masses.  EXTREMITIES:  No cyanosis, clubbing, or edema.  NEUROLOGIC:  Grossly intact.   She was here for fasting labs on March 26 and these were all within  normal  limits.   ASSESSMENT AND PLAN:  Problem #1:  Complete physical.  I encouraged her  to continue with her exercise and diet program.  Problem #2:  Hypertension, stable.  Problem #3:  Asthma, stable.  Problem #4:  Tobacco use.  I wrote for a 40-month supply of Chantix and  encouraged her to stop smoking.     Tera Mater. Clent Ridges, MD  Electronically Signed    SAF/MedQ  DD: 05/31/2006  DT: 05/31/2006  Job #: [id number]

## 2010-07-16 NOTE — Discharge Summary (Signed)
   NAME:  Vanessa Hawkins, Vanessa Hawkins                      ACCOUNT NO.:  [id number]   MEDICAL RECORD NO.:  [id number]                   PATIENT TYPE:  INP   LOCATION:  9308                                 FACILITY:  [facility]   PHYSICIAN:  Miguel Aschoff, M.D.                    DATE OF BIRTH:  Mar 22, 1965   DATE OF ADMISSION:  12/27/2002  DATE OF DISCHARGE:  12/30/2002                                 DISCHARGE SUMMARY   ADMISSION DIAGNOSIS:  Menorrhagia secondary to large uterine fibroids.   FINAL DIAGNOSIS:  Menorrhagia secondary to large uterine fibroids.   OPERATIONS AND PROCEDURES:  Total abdominal hysterectomy, general  anesthesia.   BRIEF HISTORY:  The patient is a 45 year old black female with a history of  uterine fibroids previously treated with Depo-Lupron.  The patient had a  recurrence of her symptoms with pelvic pressure and pain and heavy menses.  Because of her symptomatology, she requested a definitive procedure be  carried out to deal with the uterine fibroids and she was admitted to the  hospital on December 27, 2002 to undergo total abdominal hysterectomy.   HOSPITAL COURSE:  Preoperative studies were obtained.  This included an  admission hemoglobin of 13.5, hematocrit of 38.7, white count was 5000 on  admission.  Comprehensive metabolic panel was within normal limits.  PT and  PTT were within normal limits.  Urinalysis was negative.  On December 27, 2002 under general anesthesia, a total abdominal hysterectomy was carried  out without difficulty.  The patient had multiple intramural and subserosal  fibroids with the uterus weighing over 870 grams.  The estimated blood loss  during the procedure was approximately 200 mL.  The patient tolerated the  procedure well.   Postoperative course was essentially uncomplicated.  She tolerated  increasing ambulation and diet well.  Her hemoglobin dropped to 11.2.  She  remained afebrile, tolerated a regular diet, and was able to be  discharged  to home on the third postoperative day.  On discharge, the patient was sent  home on Demerol 50 mg every three hours if needed for pain.  She was  instructed no heavy lifting, to place nothing in the vagina, and to call if  there were any problems such as fever, pain, or heavy bleeding.  The patient  will be seen back in four weeks for a followup examination.   CONDITION ON DISCHARGE:  Improved.                                               Miguel Aschoff, M.D.    AR/MEDQ  D:  12/30/2002  T:  12/30/2002  Job:  [id number]

## 2010-07-16 NOTE — Op Note (Signed)
[place]  Patient:    Vanessa Hawkins, Vanessa Hawkins                   MRN: [id number] Proc. Date: 03/28/00 Adm. 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  OPERATION PERFORMED:  Right carpal tunnel release, dorsal wrist ganglion excision, reconstruction of ulnar collateral ligament, metacarpophalangeal joint, right thumb using the abductor pollicis longus, right wrist.  SURGEON:  Nicki Reaper, M.D.  ASSISTANTCarolyne Fiscal, RN.  ANESTHESIA:  Axillary block.  ANESTHESIOLOGIST:  Dr. Gypsy Balsam.  INDICATIONS FOR PROCEDURE:  The patient is a 45 year old female with a history of carpal tunnel syndrome, dorsal wrist ganglion and an old tear of the ulnar collateral ligament to the metacarpophalangeal joint and instability of her right thumb.  DESCRIPTION OF PROCEDURE:  The patient was brought to the operating room where an axillary block was carried out without difficulty.  She was prepped and draped using Betadine scrub and solution with the right arm free.  The limb was exsanguinated with an Esmarch bandage.  The tourniquet placed high on the arm was inflated to 225 mmHg.  The carpal tunnel was released first.  A longitudinal incision was made in the palm and carried down through subcutaneous tissues.  Bleeders were electrocauterized.  The palmar fascia was split.  The superficial palmar arch was identified.  The flexor tendon to the ring and little finger identified to the ulnar side of the median nerve, the carpal retinaculum was incised with sharp dissection.  A  right angle and Sewell retractor were placed between skin and forearm fascia.  The fascia was released for approximately 3 cm proximal to the wrist crease under direct vision.  The canal was explored.  No further lesions were identified.  The wound was irrigated.  The skin was closed with interrupted 5-0 nylon sutures. A separate incision was then made over the dorsal aspect of the wrist transversely, carried down through the subcutaneous tissue.  A cystic structure was immediately encountered.  With blunt and sharp dissection this was dissected free, filled with obvious cystic fluid.  This was followed down to the dorsal wrist capsule.  The stalk was excised, the area debrided at the wrist joint.  The wound irrigated, the ____________ wrist was then closed with interrupted 4-0 Vicryl sutures, the subcutaneous tissue was closed with interrupted  4-0 Vicryl and the skin with interrupted 5-0 nylon sutures.  A curvilinear incision was then made separately over the metacarpophalangeal joint of her thumb on the dorsal ulnar aspect and carried down through subcutaneous tissues.  Bleeders again electrocauterized. The dorsal ulnar sensory nerve was identified and protected.  The rupture to the ulnar collateral ligament was immediately apparent and there was an area of calcification in a portion of bone that had been pulled off.  The dorsal aponeurosis was incised on the ulnar side.  The rupture was immediately appraent.  With blunt and sharp dissection the dystrophic calcification was removed.  The joint was grossly unstable.  The remainder of the cartilage was intact.  A  separate incision was then made proximally for harvesting of the abductor pollicis longus.  This was done at the base of the first metacarpal. The dorsal half of the abductor was identified.  A separate incision was then made at the musculotendinous junction.  A tendon passer was then attempted to be passed but was unable to pass  through the first dorsal compartment.  A separate incision was then made.  This allowed placement of a 30 gauge stainless steel monofilament wire as a cheesecutter; however, it would not cut through the tendon.  A separate incision was then made at the distal margin of the first dorsal compartment and the dorsal half of the abductor was then harvested.  Care was taken to protect the radial sensory nerve throughout each incision.  The dorsal half of the abductor pollicis longus was then harvested, left attached distally and brought out through the distal wound.  A tunnel was then passed through the dorsal aspect of the  first metacarpal.  This was made beneath the periosteum as much as possible deep to the extensor pollicis brevis and extensor pollicis longus tendon and brought out to the old insertion of the ulnar collateral ligament.  A groove was then made into the metacarpal head.  A drill hole was placed into the ulnar aspect of the proximal phalanx base on the ulnar aspect.  Drill holes were passed through for placement of a pullout button and wire. A 2-0 Prolene suture was then sutured into the tendon in a Bunnell type weave.  This was then pulled into the defect created in the base of the proximal phalanx.  The remainder of the tendon was then brought out with reconstruction of the accessory ligament. This was reconstructed having removed the area of dystrophic calcification. This was then sutured down to itself recreating the accessory collateral ligament.  The finger was placed through a full range of motion, was stable in all joint positions, having been tied over a button on the radial aspect.  The wounds were irrigated, the capsule closed with interrupted 4-0 Vicryl.  The retinaculum and dorsal capsule closed with 4-0 Vicryl indepently.  The abductor aponeurosis was repaired with 4-0 Vicryl and the skin with interrupted 5-0 nylon sutures.  It was decided not to place a  protective ____________ .  The hand was placed in a bulky compressive dressing, thumb spica splint.  The patient tolerated the procedure well and was taken to the recovery room for observation in satisfactory condition.  She is discharged  home to return to the [facility] in one week on Talwin NX and Keflex. DD:  03/28/00 TD:  03/28/00 Job: [id number]

## 2010-07-16 NOTE — Op Note (Signed)
NAME:  Vanessa Hawkins, Vanessa Hawkins                      ACCOUNT NO.:  [id number]   MEDICAL RECORD NO.:  [id number]                   PATIENT TYPE:  INP   LOCATION:  NA                                   FACILITY:  [facility]   PHYSICIAN:  Miguel Aschoff, M.D.                    DATE OF BIRTH:  1965-05-25   DATE OF PROCEDURE:  12/27/2002  DATE OF DISCHARGE:                                 OPERATIVE REPORT   PREOPERATIVE DIAGNOSIS:  Menorrhagia secondary to large uterine fibroids.   POSTOPERATIVE DIAGNOSIS:  Menorrhagia secondary to large uterine fibroids.   PROCEDURE:  Total abdominal hysterectomy.   SURGEON:  Miguel Aschoff, M.D.   ASSISTANT:  Luvenia Redden, M.D.   ANESTHESIA:  General.   COMPLICATIONS:  None.   JUSTIFICATION:  The patient is a 45 year old black female with a history of  menorrhagia secondary to large 14-16 week uterine fibroids.  The patient has  been treated in the past with Lupron Depot in an effort to control the  bleeding and shrink the fibroids; however, after completing therapy the  fibroids have regrown and the menorrhagia has returned.  She presents now to  undergo definitive therapy via total abdominal hysterectomy.  Risks and  benefits of the procedure were discussed with the patient.   DESCRIPTION OF PROCEDURE:  The patient was taken to the operating room and  placed in the supine position and general anesthesia was administered  without difficulty.  She was prepped and draped in the usual sterile  fashion.  A Foley catheter was inserted.  A Pfannenstiel incision was made,  extended down through the subcutaneous tissue with bleeding points being  clamped and coagulated as they were encountered.  The fascia was then  identified then incised transversely.  The fascia was then separated from  the underlying rectus muscles.  Rectus muscles were divided in the midline.  The peritoneum was then found and entered, avoiding underlying structures.  A self-retaining retractor  was placed through the wound, viscera were packed  out of the pelvis.  The findings revealed 16-week size uterus with multiple  intramural and subserosal fibroids, with simple cysts on the left ovary.  The tubes were within normal limits.  The right ovary was noted within  normal limits.  The intestines appeared to be unremarkable.  At this point  the round ligaments were identified, clamped, cut, and suture ligated using  suture ligatures of 0 Vicryl.  The bladder flap was then created anteriorly.  The broad ligament was then skeletonized.  The utero-ovarian ligaments were  then clamped, cut, and suture ligated, first with suture ligatures of 0  Vicryl and then free ties of 0 Vicryl.  The uterine vessels were then found,  clamped with curved Heaney clamps, cut, and suture ligated using suture  ligatures of 0 Vicryl.  Then using straight Heaney clamps an additional bite  was taken in the paracervical  fascia in an effort to remove the cervix.  It  was necessary to incise the fundus of the uterus.  This allowed exposure for  removal of the cervix.  Then using serial straight Heaney clamps, the  paracervical fascia was clamped, cut, and suture ligated.  Then the  uterosacral ligaments and cardinal ligaments were clamped, cut, and suture  ligated using curved Heaney clamps and suture ligatures of 0 Vicryl.  It was  then possible to place clamps across the cervix and the cervix was excised.  The vaginal cuff was then closed.  The pedicles were closed using  interrupted 0 Vicryl sutures and the vaginal cuff was closed using  interrupted 0 Vicryl sutures.  Inspection made for hemostasis.  Hemostasis  appeared to be excellent.  At this point the pelvic floor was  reperitonealized using running continuous 2-0 Vicryl suture.  Pedicles were  placed in retroperitoneal positions.  The abdomen was then irrigated with  warm saline.  Lap counts were taken and found to be correct and then the  abdomen was  closed.  The parietal peritoneum was closed using running  continuous 0 Vicryl suture.  The rectus muscles were reapproximated using  running continuous 0 Vicryl suture.  The fascia was then closed using two  sutures of 0 Vicryl each starting at the lateral vaginal angles and meeting  in the midline.  The subcutaneous tissue and skin were closed using staples.  The estimated blood loss was approximately 200 mL.  The patient tolerated  the procedure well and went to the recovery room in satisfactory condition.                                               Miguel Aschoff, M.D.    AR/MEDQ  D:  12/27/2002  T:  12/27/2002  Job:  [id number]

## 2010-07-16 NOTE — Progress Notes (Signed)
  Subjective:    Patient ID: Vanessa Hawkins, female    DOB: 08-10-1965, 45 y.o.   MRN: [id number]  HPI Here to follow up on depression and insomnia. She is very pleased with the Zoloft and she sleeps well with the temazepam. She has no side effects. Her psychotherapy is going well also.    Review of Systems  Constitutional: Negative.   Psychiatric/Behavioral: Negative.        Objective:   Physical Exam  Constitutional: She appears well-developed and well-nourished.  Psychiatric: She has a normal mood and affect. Her behavior is normal. Judgment and thought content normal.          Assessment & Plan:  She is doing very well. We will keep things the way they are for now.

## 2010-10-29 ENCOUNTER — Other Ambulatory Visit: Payer: Self-pay | Admitting: Family Medicine

## 2010-11-23 ENCOUNTER — Other Ambulatory Visit (INDEPENDENT_AMBULATORY_CARE_PROVIDER_SITE_OTHER): Payer: 59

## 2010-11-23 DIAGNOSIS — Z0000 Encounter for general adult medical examination without abnormal findings: Secondary | ICD-10-CM

## 2010-11-23 LAB — POCT URINALYSIS DIPSTICK
Bilirubin, UA: NEGATIVE
Blood, UA: NEGATIVE
Glucose, UA: NEGATIVE
Ketones, UA: NEGATIVE
Leukocytes, UA: NEGATIVE
Nitrite, UA: NEGATIVE
Protein, UA: NEGATIVE
Spec Grav, UA: 1.025
Urobilinogen, UA: 0.2
pH, UA: 6

## 2010-11-23 LAB — LIPID PANEL
Cholesterol: 191 mg/dL (ref 0–200)
HDL: 54.3 mg/dL (ref >39.00)
LDL Cholesterol: 125 mg/dL — ABNORMAL HIGH (ref 0–99)
Total CHOL/HDL Ratio: 4
Triglycerides: 59 mg/dL (ref 0.0–149.0)
VLDL: 11.8 mg/dL (ref 0.0–40.0)

## 2010-11-23 LAB — BASIC METABOLIC PANEL
BUN: 8 mg/dL (ref 6–23)
CO2: 28 mEq/L (ref 19–32)
Calcium: 9.1 mg/dL (ref 8.4–10.5)
Chloride: 107 mEq/L (ref 96–112)
Creatinine, Ser: 0.8 mg/dL (ref 0.4–1.2)
GFR: 101.28 mL/min (ref >60.00)
Glucose, Bld: 88 mg/dL (ref 70–99)
Potassium: 3.9 mEq/L (ref 3.5–5.1)
Sodium: 142 mEq/L (ref 135–145)

## 2010-11-23 LAB — HEPATIC FUNCTION PANEL
ALT: 14 U/L (ref 0–35)
AST: 19 U/L (ref 0–37)
Albumin: 4.2 g/dL (ref 3.5–5.2)
Alkaline Phosphatase: 126 U/L — ABNORMAL HIGH (ref 39–117)
Bilirubin, Direct: 0 mg/dL (ref 0.0–0.3)
Total Bilirubin: 0.5 mg/dL (ref 0.3–1.2)
Total Protein: 7.4 g/dL (ref 6.0–8.3)

## 2010-11-23 LAB — CBC WITH DIFFERENTIAL/PLATELET
Basophils Absolute: 0 10*3/uL (ref 0.0–0.1)
Basophils Relative: 0.4 % (ref 0.0–3.0)
Eosinophils Absolute: 0.1 10*3/uL (ref 0.0–0.7)
Eosinophils Relative: 1.5 % (ref 0.0–5.0)
HCT: 41.8 % (ref 36.0–46.0)
Hemoglobin: 13.9 g/dL (ref 12.0–15.0)
Lymphocytes Relative: 35.8 % (ref 12.0–46.0)
Lymphs Abs: 1.5 10*3/uL (ref 0.7–4.0)
MCHC: 33.2 g/dL (ref 30.0–36.0)
MCV: 98.5 fl (ref 78.0–100.0)
Monocytes Absolute: 0.3 10*3/uL (ref 0.1–1.0)
Monocytes Relative: 6.9 % (ref 3.0–12.0)
Neutro Abs: 2.4 10*3/uL (ref 1.4–7.7)
Neutrophils Relative %: 55.4 % (ref 43.0–77.0)
Platelets: 312 10*3/uL (ref 150.0–400.0)
RBC: 4.25 Mil/uL (ref 3.87–5.11)
RDW: 14.5 % (ref 11.5–14.6)
WBC: 4.3 10*3/uL — ABNORMAL LOW (ref 4.5–10.5)

## 2010-11-23 LAB — TSH: TSH: 1.61 u[IU]/mL (ref 0.35–5.50)

## 2010-11-30 ENCOUNTER — Encounter: Payer: Self-pay | Admitting: Family Medicine

## 2010-11-30 ENCOUNTER — Telehealth: Payer: Self-pay | Admitting: Family Medicine

## 2010-11-30 ENCOUNTER — Ambulatory Visit (INDEPENDENT_AMBULATORY_CARE_PROVIDER_SITE_OTHER): Payer: 59 | Admitting: Family Medicine

## 2010-11-30 VITALS — BP 124/80 | HR 108 | Temp 98.4°F | Ht 67.75 in | Wt 228.0 lb

## 2010-11-30 DIAGNOSIS — Z23 Encounter for immunization: Secondary | ICD-10-CM

## 2010-11-30 DIAGNOSIS — Z0000 Encounter for general adult medical examination without abnormal findings: Secondary | ICD-10-CM

## 2010-11-30 MED ORDER — TEMAZEPAM 30 MG PO CAPS: 30.0000 mg | ORAL_CAPSULE | Freq: Every evening | ORAL | Status: DC | PRN

## 2010-11-30 MED ORDER — ALBUTEROL SULFATE HFA 108 (90 BASE) MCG/ACT IN AERS: 2.0000 | INHALATION_SPRAY | RESPIRATORY_TRACT | Status: DC | PRN

## 2010-11-30 MED ORDER — HYDROXYZINE HCL 25 MG PO TABS: 25.0000 mg | ORAL_TABLET | Freq: Three times a day (TID) | ORAL | Status: DC | PRN

## 2010-11-30 NOTE — Telephone Encounter (Signed)
Message copied by Baldemar Friday on Tue Nov 30, 2010  2:31 PM ------      Message from: Gershon Crane A      Created: Mon Nov 29, 2010  5:07 AM       Normal except mildly high chol. Watch the diet

## 2010-11-30 NOTE — Progress Notes (Signed)
Addended by: Aniceto Boss A on: 11/30/2010 11:38 AM   Modules accepted: Orders

## 2010-11-30 NOTE — Progress Notes (Signed)
  Subjective:    Patient ID: Hilton Cork, female    DOB: [birth year], 45 y.o.   MRN: [id number]  HPI 45 yr old female for a cpx. She feels well and has no concerns. Trying to lose weight.    Review of Systems  Constitutional: Negative.   HENT: Negative.   Eyes: Negative.   Respiratory: Negative.   Cardiovascular: Negative.   Gastrointestinal: Negative.   Genitourinary: Negative for dysuria, urgency, frequency, hematuria, flank pain, decreased urine volume, enuresis, difficulty urinating, pelvic pain and dyspareunia.  Musculoskeletal: Negative.   Skin: Negative.   Neurological: Negative.   Hematological: Negative.   Psychiatric/Behavioral: Negative.        Objective:   Physical Exam  Constitutional: She is oriented to person, place, and time. She appears well-developed and well-nourished. No distress.  HENT:  Head: Normocephalic and atraumatic.  Right Ear: External ear normal.  Left Ear: External ear normal.  Nose: Nose normal.  Mouth/Throat: Oropharynx is clear and moist. No oropharyngeal exudate.  Eyes: Conjunctivae and EOM are normal. Pupils are equal, round, and reactive to light. No scleral icterus.  Neck: Normal range of motion. Neck supple. No JVD present. No thyromegaly present.  Cardiovascular: Normal rate, regular rhythm, normal heart sounds and intact distal pulses.  Exam reveals no gallop and no friction rub.   No murmur heard. Pulmonary/Chest: Effort normal and breath sounds normal. No respiratory distress. She has no wheezes. She has no rales. She exhibits no tenderness.  Abdominal: Soft. Bowel sounds are normal. She exhibits no distension and no mass. There is no tenderness. There is no rebound and no guarding.  Musculoskeletal: Normal range of motion. She exhibits no edema and no tenderness.  Lymphadenopathy:    She has no cervical adenopathy.  Neurological: She is alert and oriented to person, place, and time. She has normal reflexes. No cranial nerve  deficit. She exhibits normal muscle tone. Coordination normal.  Skin: Skin is warm and dry. No rash noted. No erythema.  Psychiatric: She has a normal mood and affect. Her behavior is normal. Judgment and thought content normal.          Assessment & Plan:  Doing well.

## 2010-11-30 NOTE — Telephone Encounter (Signed)
Spoke with pt and gave results. 

## 2011-01-25 ENCOUNTER — Telehealth: Payer: Self-pay | Admitting: Family Medicine

## 2011-01-25 NOTE — Telephone Encounter (Signed)
Pt stated when she was in for her physical her medication refills were going to be called for for 3 month refills because it would be less expensive through his insurance. When refills were called in they were called in for 3  Monthly refills. Pt requesting for a 90 day supply for her meds be sent into pharmacy. Please contact pt

## 2011-01-27 NOTE — Telephone Encounter (Signed)
Left voice message for pt to return my call and leave names of the medications that she needs.

## 2011-01-31 NOTE — Telephone Encounter (Signed)
Pt needs #1 Proventil, #2 Lotrel, #3 Hydroxyzine and x 3 months supply for these. Pt only wants a 1 month supply of the Restoril. Please call pt when approved. Can we do this?

## 2011-01-31 NOTE — Telephone Encounter (Signed)
This is okay.

## 2011-02-01 MED ORDER — AMLODIPINE BESY-BENAZEPRIL HCL 10-20 MG PO CAPS: 1.0000 | ORAL_CAPSULE | Freq: Every day | ORAL | Status: DC

## 2011-02-01 MED ORDER — ALBUTEROL SULFATE HFA 108 (90 BASE) MCG/ACT IN AERS: 2.0000 | INHALATION_SPRAY | RESPIRATORY_TRACT | Status: DC | PRN

## 2011-02-01 MED ORDER — HYDROXYZINE HCL 25 MG PO TABS: 25.0000 mg | ORAL_TABLET | Freq: Three times a day (TID) | ORAL | Status: DC | PRN

## 2011-02-01 MED ORDER — TEMAZEPAM 30 MG PO CAPS: 30.0000 mg | ORAL_CAPSULE | Freq: Every evening | ORAL | Status: DC | PRN

## 2011-02-01 NOTE — Telephone Encounter (Signed)
Script sent e-scribe and faxed the Restoril.

## 2011-08-10 ENCOUNTER — Other Ambulatory Visit: Payer: Self-pay | Admitting: Family Medicine

## 2011-08-12 NOTE — Telephone Encounter (Signed)
Call in a one year supply  

## 2011-09-26 ENCOUNTER — Other Ambulatory Visit: Payer: Self-pay | Admitting: Family Medicine

## 2011-09-29 NOTE — Telephone Encounter (Signed)
Call in #30 with 5 rf 

## 2012-01-18 HISTORY — PX: ROTATOR CUFF REPAIR: SHX139

## 2012-03-16 ENCOUNTER — Other Ambulatory Visit: Payer: Self-pay | Admitting: Family Medicine

## 2012-03-27 ENCOUNTER — Other Ambulatory Visit (INDEPENDENT_AMBULATORY_CARE_PROVIDER_SITE_OTHER): Payer: 59

## 2012-03-27 DIAGNOSIS — Z0000 Encounter for general adult medical examination without abnormal findings: Secondary | ICD-10-CM

## 2012-03-27 LAB — HEPATIC FUNCTION PANEL
ALT: 18 U/L (ref 0–35)
AST: 19 U/L (ref 0–37)
Albumin: 4.2 g/dL (ref 3.5–5.2)
Alkaline Phosphatase: 129 U/L — ABNORMAL HIGH (ref 39–117)
Bilirubin, Direct: 0 mg/dL (ref 0.0–0.3)
Total Bilirubin: 0.6 mg/dL (ref 0.3–1.2)
Total Protein: 7.7 g/dL (ref 6.0–8.3)

## 2012-03-27 LAB — CBC WITH DIFFERENTIAL/PLATELET
Basophils Absolute: 0 10*3/uL (ref 0.0–0.1)
Basophils Relative: 0.1 % (ref 0.0–3.0)
Eosinophils Absolute: 0.1 10*3/uL (ref 0.0–0.7)
Eosinophils Relative: 2.6 % (ref 0.0–5.0)
HCT: 40.9 % (ref 36.0–46.0)
Hemoglobin: 13.9 g/dL (ref 12.0–15.0)
Lymphocytes Relative: 40.4 % (ref 12.0–46.0)
Lymphs Abs: 2 10*3/uL (ref 0.7–4.0)
MCHC: 33.9 g/dL (ref 30.0–36.0)
MCV: 94.9 fl (ref 78.0–100.0)
Monocytes Absolute: 0.3 10*3/uL (ref 0.1–1.0)
Monocytes Relative: 5.4 % (ref 3.0–12.0)
Neutro Abs: 2.5 10*3/uL (ref 1.4–7.7)
Neutrophils Relative %: 51.5 % (ref 43.0–77.0)
Platelets: 319 10*3/uL (ref 150.0–400.0)
RBC: 4.31 Mil/uL (ref 3.87–5.11)
RDW: 14 % (ref 11.5–14.6)
WBC: 4.8 10*3/uL (ref 4.5–10.5)

## 2012-03-27 LAB — POCT URINALYSIS DIPSTICK
Bilirubin, UA: NEGATIVE
Blood, UA: NEGATIVE
Glucose, UA: NEGATIVE
Ketones, UA: NEGATIVE
Leukocytes, UA: NEGATIVE
Nitrite, UA: NEGATIVE
Protein, UA: NEGATIVE
Spec Grav, UA: 1.025
Urobilinogen, UA: 1
pH, UA: 5.5

## 2012-03-27 LAB — BASIC METABOLIC PANEL
BUN: 6 mg/dL (ref 6–23)
CO2: 27 mEq/L (ref 19–32)
Calcium: 9.6 mg/dL (ref 8.4–10.5)
Chloride: 107 mEq/L (ref 96–112)
Creatinine, Ser: 0.7 mg/dL (ref 0.4–1.2)
GFR: 108.57 mL/min (ref >60.00)
Glucose, Bld: 109 mg/dL — ABNORMAL HIGH (ref 70–99)
Potassium: 4.3 mEq/L (ref 3.5–5.1)
Sodium: 140 mEq/L (ref 135–145)

## 2012-03-27 LAB — LDL CHOLESTEROL, DIRECT: Direct LDL: 131.9 mg/dL

## 2012-03-27 LAB — TSH: TSH: 1.31 u[IU]/mL (ref 0.35–5.50)

## 2012-03-27 LAB — LIPID PANEL
Cholesterol: 205 mg/dL — ABNORMAL HIGH (ref 0–200)
HDL: 45.6 mg/dL (ref >39.00)
Total CHOL/HDL Ratio: 4
Triglycerides: 85 mg/dL (ref 0.0–149.0)
VLDL: 17 mg/dL (ref 0.0–40.0)

## 2012-04-02 NOTE — Progress Notes (Signed)
Quick Note:  Pt has CPE on 04/04/12 will go over then. ______ 

## 2012-04-04 ENCOUNTER — Encounter: Payer: Self-pay | Admitting: Family Medicine

## 2012-04-04 ENCOUNTER — Ambulatory Visit (INDEPENDENT_AMBULATORY_CARE_PROVIDER_SITE_OTHER): Payer: 59 | Admitting: Family Medicine

## 2012-04-04 VITALS — HR 115 | Temp 98.0°F | Ht 68.25 in | Wt 238.0 lb

## 2012-04-04 DIAGNOSIS — Z0000 Encounter for general adult medical examination without abnormal findings: Secondary | ICD-10-CM

## 2012-04-04 MED ORDER — BUPROPION HCL ER (XL) 150 MG PO TB24: 150.0000 mg | ORAL_TABLET | Freq: Every day | ORAL | Status: DC

## 2012-04-04 MED ORDER — AMLODIPINE BESY-BENAZEPRIL HCL 5-20 MG PO CAPS: 1.0000 | ORAL_CAPSULE | Freq: Every day | ORAL | Status: DC

## 2012-04-04 NOTE — Progress Notes (Signed)
  Subjective:    Patient ID: Vanessa Hawkins, female    DOB: [birth year], 47 y.o.   MRN: [id number]  HPI 46 yr old female for a cpx. She stopped Zoloft because of weight gain, and she wants to try wellbutrin again. She gets lightheaded spells at times and her BP may drop to 110/50 or so. She has not been exercising.    Review of Systems  Constitutional: Negative.   HENT: Negative.   Eyes: Negative.   Respiratory: Negative.   Cardiovascular: Negative.   Gastrointestinal: Negative.   Genitourinary: Negative for dysuria, urgency, frequency, hematuria, flank pain, decreased urine volume, enuresis, difficulty urinating, pelvic pain and dyspareunia.  Musculoskeletal: Negative.   Skin: Negative.   Neurological: Positive for light-headedness. Negative for dizziness, tremors, seizures, syncope, facial asymmetry, speech difficulty, weakness, numbness and headaches.  Hematological: Negative.   Psychiatric/Behavioral: Negative.        Objective:   Physical Exam  Constitutional: She is oriented to person, place, and time. She appears well-developed and well-nourished. No distress.  HENT:  Head: Normocephalic and atraumatic.  Right Ear: External ear normal.  Left Ear: External ear normal.  Nose: Nose normal.  Mouth/Throat: Oropharynx is clear and moist. No oropharyngeal exudate.  Eyes: Conjunctivae normal and EOM are normal. Pupils are equal, round, and reactive to light. Right eye exhibits no discharge. Left eye exhibits no discharge. No scleral icterus.  Neck: Normal range of motion. Neck supple. No JVD present. No thyromegaly present.  Cardiovascular: Normal rate, regular rhythm, normal heart sounds and intact distal pulses.  Exam reveals no gallop and no friction rub.   No murmur heard. Pulmonary/Chest: Effort normal and breath sounds normal. No stridor. No respiratory distress. She has no wheezes. She has no rales. She exhibits no tenderness.  Abdominal: Soft. Normal appearance and  bowel sounds are normal. She exhibits no distension, no abdominal bruit, no ascites and no mass. There is no hepatosplenomegaly. There is no tenderness. There is no rigidity, no rebound and no guarding. No hernia.  Genitourinary: Rectum normal, vagina normal and uterus normal. No breast swelling, tenderness, discharge or bleeding. Cervix exhibits no motion tenderness, no discharge and no friability. Right adnexum displays no mass, no tenderness and no fullness. Left adnexum displays no mass, no tenderness and no fullness. No erythema, tenderness or bleeding around the vagina. No vaginal discharge found.  Musculoskeletal: Normal range of motion. She exhibits no edema and no tenderness.  Lymphadenopathy:    She has no cervical adenopathy.  Neurological: She is alert and oriented to person, place, and time. She has normal reflexes. No cranial nerve deficit. She exhibits normal muscle tone. Coordination normal.  Skin: Skin is warm and dry. No rash noted. She is not diaphoretic. No erythema. No pallor.  Psychiatric: She has a normal mood and affect. Her behavior is normal. Judgment and thought content normal.          Assessment & Plan:  Well exam. She needs to lose weight. Change the Lotrel to 5/20 to avoid it dropping too low. Get back on Wellbutrin. Recheck in 90 days

## 2012-04-16 ENCOUNTER — Other Ambulatory Visit: Payer: Self-pay | Admitting: Family Medicine

## 2012-04-17 NOTE — Telephone Encounter (Signed)
Call in #30 with 5 rf 

## 2012-07-04 ENCOUNTER — Ambulatory Visit: Payer: 59 | Admitting: Family Medicine

## 2012-07-04 DIAGNOSIS — Z0289 Encounter for other administrative examinations: Secondary | ICD-10-CM

## 2012-08-17 ENCOUNTER — Other Ambulatory Visit: Payer: Self-pay | Admitting: Family Medicine

## 2012-10-04 ENCOUNTER — Ambulatory Visit (INDEPENDENT_AMBULATORY_CARE_PROVIDER_SITE_OTHER): Payer: 59 | Admitting: Family Medicine

## 2012-10-04 ENCOUNTER — Encounter: Payer: Self-pay | Admitting: Family Medicine

## 2012-10-04 VITALS — BP 104/64 | HR 93 | Temp 98.7°F | Wt 242.0 lb

## 2012-10-04 DIAGNOSIS — M546 Pain in thoracic spine: Secondary | ICD-10-CM

## 2012-10-04 MED ORDER — NAPROXEN 500 MG PO TABS: 500.0000 mg | ORAL_TABLET | Freq: Two times a day (BID) | ORAL | Status: DC

## 2012-10-04 NOTE — Patient Instructions (Addendum)
Touch base by next week if no better and sooner if any new or worsening symptoms.

## 2012-10-04 NOTE — Progress Notes (Signed)
  Subjective:    Patient ID: Vanessa Hawkins, female    DOB: June 19, 1965, 47 y.o.   MRN: [id number]  HPI  Left flank pain onset Tuesday.  Radiates front to back. Thought this was gas and took gas-X with no improvement. Dull pain.  Constant.  Not associated with movement. Severity 6/10.  Worse sitting.  No alleviating. NKI.    Denies pleuritic pain. No fevers or chills  Past Medical History  Diagnosis Date  . Asthma   . Allergy   . Depression   . Hypertension   . Gynecological examination     sees Dr. Duane Lope    Past Surgical History  Procedure Laterality Date  . Abdominal hysterectomy    . Cholecystectomy    . Carpal tunnel release    . Cervical disc surgery      c4-5 dr Danielle Dess  . Rotator cuff repair  01-18-12    left shoulder, per Dr. Madelon Lips     reports that she has been smoking Cigarettes.  She has a 20 pack-year smoking history. She has never used smokeless tobacco. She reports that  drinks alcohol. She reports that she does not use illicit drugs. family history includes Diabetes in an unspecified family member; Hyperlipidemia in an unspecified family member; Hypertension in an unspecified family member; and Uterine cancer in an unspecified family member. Allergies  Allergen Reactions  . Oxycodone-Acetaminophen      Review of Systems  Constitutional: Negative for fever and chills.  Respiratory: Negative for cough, shortness of breath and wheezing.   Cardiovascular: Negative for chest pain and leg swelling.  Gastrointestinal: Negative for nausea, vomiting, diarrhea and blood in stool.  Skin: Negative for rash.  Hematological: Negative for adenopathy.       Objective:   Physical Exam  Constitutional: She appears well-developed and well-nourished.  Cardiovascular: Normal rate and regular rhythm.  Exam reveals no friction rub.   Pulmonary/Chest: Effort normal and breath sounds normal. No respiratory distress. She has no wheezes. She has no rales.   Musculoskeletal: She exhibits no edema.  Patient some tenderness along the left upper thoracic region.  No rash  Skin: No rash noted.          Assessment & Plan:  Left thoracic pain. Slightly tender to palpation. Poorly localized. Suspect musculoskeletal. Trial of naproxen 500 mg twice a day with food

## 2012-11-16 ENCOUNTER — Other Ambulatory Visit: Payer: Self-pay | Admitting: Family Medicine

## 2012-11-16 NOTE — Telephone Encounter (Signed)
Call in #30 with 5 rf 

## 2013-01-14 ENCOUNTER — Other Ambulatory Visit: Payer: Self-pay | Admitting: Family Medicine

## 2013-01-18 ENCOUNTER — Other Ambulatory Visit: Payer: Self-pay | Admitting: Family Medicine

## 2013-04-24 ENCOUNTER — Other Ambulatory Visit: Payer: Self-pay | Admitting: Obstetrics and Gynecology

## 2013-04-28 ENCOUNTER — Other Ambulatory Visit: Payer: Self-pay | Admitting: Family Medicine

## 2013-06-07 ENCOUNTER — Other Ambulatory Visit: Payer: Self-pay | Admitting: Family Medicine

## 2013-06-11 ENCOUNTER — Encounter: Payer: Self-pay | Admitting: *Deleted

## 2013-06-12 ENCOUNTER — Other Ambulatory Visit (INDEPENDENT_AMBULATORY_CARE_PROVIDER_SITE_OTHER): Payer: 59

## 2013-06-12 DIAGNOSIS — Z0000 Encounter for general adult medical examination without abnormal findings: Secondary | ICD-10-CM

## 2013-06-12 LAB — CBC WITH DIFFERENTIAL/PLATELET
Basophils Absolute: 0 10*3/uL (ref 0.0–0.1)
Basophils Relative: 0.4 % (ref 0.0–3.0)
Eosinophils Absolute: 0.1 10*3/uL (ref 0.0–0.7)
Eosinophils Relative: 1.5 % (ref 0.0–5.0)
HCT: 39.7 % (ref 36.0–46.0)
Hemoglobin: 13.5 g/dL (ref 12.0–15.0)
Lymphocytes Relative: 41.1 % (ref 12.0–46.0)
Lymphs Abs: 2.3 10*3/uL (ref 0.7–4.0)
MCHC: 33.9 g/dL (ref 30.0–36.0)
MCV: 95.3 fl (ref 78.0–100.0)
Monocytes Absolute: 0.3 10*3/uL (ref 0.1–1.0)
Monocytes Relative: 5.1 % (ref 3.0–12.0)
Neutro Abs: 2.8 10*3/uL (ref 1.4–7.7)
Neutrophils Relative %: 51.9 % (ref 43.0–77.0)
Platelets: 336 10*3/uL (ref 150.0–400.0)
RBC: 4.17 Mil/uL (ref 3.87–5.11)
RDW: 13.6 % (ref 11.5–14.6)
WBC: 5.5 10*3/uL (ref 4.5–10.5)

## 2013-06-12 LAB — HEPATIC FUNCTION PANEL
ALT: 18 U/L (ref 0–35)
AST: 19 U/L (ref 0–37)
Albumin: 4 g/dL (ref 3.5–5.2)
Alkaline Phosphatase: 112 U/L (ref 39–117)
Bilirubin, Direct: 0 mg/dL (ref 0.0–0.3)
Total Bilirubin: 0.5 mg/dL (ref 0.3–1.2)
Total Protein: 7.3 g/dL (ref 6.0–8.3)

## 2013-06-12 LAB — TSH: TSH: 1.08 u[IU]/mL (ref 0.35–5.50)

## 2013-06-12 LAB — POCT URINALYSIS DIPSTICK
Bilirubin, UA: NEGATIVE
Blood, UA: NEGATIVE
Glucose, UA: NEGATIVE
Ketones, UA: NEGATIVE
Leukocytes, UA: NEGATIVE
Nitrite, UA: NEGATIVE
Protein, UA: NEGATIVE
Spec Grav, UA: 1.015
Urobilinogen, UA: 0.2
pH, UA: 7.5

## 2013-06-12 LAB — LIPID PANEL
Cholesterol: 185 mg/dL (ref 0–200)
HDL: 46.3 mg/dL (ref >39.00)
LDL Cholesterol: 125 mg/dL — ABNORMAL HIGH (ref 0–99)
Total CHOL/HDL Ratio: 4
Triglycerides: 71 mg/dL (ref 0.0–149.0)
VLDL: 14.2 mg/dL (ref 0.0–40.0)

## 2013-06-12 LAB — BASIC METABOLIC PANEL
BUN: 8 mg/dL (ref 6–23)
CO2: 30 mEq/L (ref 19–32)
Calcium: 9.6 mg/dL (ref 8.4–10.5)
Chloride: 105 mEq/L (ref 96–112)
Creatinine, Ser: 0.7 mg/dL (ref 0.4–1.2)
GFR: 117.08 mL/min (ref >60.00)
Glucose, Bld: 86 mg/dL (ref 70–99)
Potassium: 4.1 mEq/L (ref 3.5–5.1)
Sodium: 140 mEq/L (ref 135–145)

## 2013-06-17 ENCOUNTER — Encounter: Payer: Self-pay | Admitting: Family Medicine

## 2013-06-17 ENCOUNTER — Ambulatory Visit (INDEPENDENT_AMBULATORY_CARE_PROVIDER_SITE_OTHER): Payer: 59 | Admitting: Family Medicine

## 2013-06-17 VITALS — BP 120/80 | HR 103 | Temp 98.8°F | Ht 68.25 in | Wt 242.0 lb

## 2013-06-17 DIAGNOSIS — F411 Generalized anxiety disorder: Secondary | ICD-10-CM

## 2013-06-17 DIAGNOSIS — Z0000 Encounter for general adult medical examination without abnormal findings: Secondary | ICD-10-CM

## 2013-06-17 MED ORDER — ALPRAZOLAM 0.5 MG PO TABS: 0.5000 mg | ORAL_TABLET | Freq: Two times a day (BID) | ORAL | Status: DC

## 2013-06-17 MED ORDER — FLUTICASONE PROPIONATE 50 MCG/ACT NA SUSP: 2.0000 | Freq: Every day | NASAL | Status: DC

## 2013-06-17 MED ORDER — AMLODIPINE BESY-BENAZEPRIL HCL 5-20 MG PO CAPS
ORAL_CAPSULE | ORAL | Status: DC
Start: 2013-06-17 — End: 2013-10-07

## 2013-06-17 MED ORDER — BUPROPION HCL ER (XL) 150 MG PO TB24: ORAL_TABLET | ORAL | Status: DC

## 2013-06-17 MED ORDER — ALBUTEROL SULFATE HFA 108 (90 BASE) MCG/ACT IN AERS: INHALATION_SPRAY | RESPIRATORY_TRACT | Status: DC

## 2013-06-17 MED ORDER — TEMAZEPAM 30 MG PO CAPS: ORAL_CAPSULE | ORAL | Status: DC

## 2013-06-17 NOTE — Progress Notes (Signed)
Pre visit review using our clinic review tool, if applicable. No additional management support is needed unless otherwise documented below in the visit note. 

## 2013-06-17 NOTE — Progress Notes (Signed)
   Subjective:    Patient ID: Vanessa Hawkins, female    DOB: [birth year], 48 y.o.   MRN: [id number]  HPI 48 yr old female for a cpx. She has few complaints. First even though her BP is stable at home she often feels lightheaded for an hour or two in the mornings, then this passes for the rest of the day. No other sx. She takes her BP med in the mornings. Also she asks for some help with anxiety. She has been taking Wellbutrin for a few years and this has helped her depression sx, however she feels very anxious at times. She has used Xanax in the past with success.    Review of Systems  Constitutional: Negative.   HENT: Negative.   Eyes: Negative.   Respiratory: Negative.   Cardiovascular: Negative.   Gastrointestinal: Negative.   Genitourinary: Negative for dysuria, urgency, frequency, hematuria, flank pain, decreased urine volume, enuresis, difficulty urinating, pelvic pain and dyspareunia.  Musculoskeletal: Negative.   Skin: Negative.   Neurological: Positive for light-headedness. Negative for dizziness, tremors, seizures, syncope, facial asymmetry, speech difficulty, weakness, numbness and headaches.  Psychiatric/Behavioral: Negative for hallucinations, confusion, dysphoric mood, decreased concentration and agitation. The patient is nervous/anxious. The patient is not hyperactive.        Objective:   Physical Exam  Constitutional: She is oriented to person, place, and time. She appears well-developed and well-nourished. No distress.  HENT:  Head: Normocephalic and atraumatic.  Right Ear: External ear normal.  Left Ear: External ear normal.  Nose: Nose normal.  Mouth/Throat: Oropharynx is clear and moist. No oropharyngeal exudate.  Eyes: Conjunctivae and EOM are normal. Pupils are equal, round, and reactive to light. No scleral icterus.  Neck: Normal range of motion. Neck supple. No JVD present. No thyromegaly present.  Cardiovascular: Normal rate, regular rhythm, normal heart  sounds and intact distal pulses.  Exam reveals no gallop and no friction rub.   No murmur heard. Pulmonary/Chest: Effort normal and breath sounds normal. No respiratory distress. She has no wheezes. She has no rales. She exhibits no tenderness.  Abdominal: Soft. Bowel sounds are normal. She exhibits no distension and no mass. There is no tenderness. There is no rebound and no guarding.  Musculoskeletal: Normal range of motion. She exhibits no edema and no tenderness.  Lymphadenopathy:    She has no cervical adenopathy.  Neurological: She is alert and oriented to person, place, and time. She has normal reflexes. No cranial nerve deficit. She exhibits normal muscle tone. Coordination normal.  Skin: Skin is warm and dry. No rash noted. No erythema.  Psychiatric: She has a normal mood and affect. Her behavior is normal. Judgment and thought content normal.          Assessment & Plan:  Well exam. Her morning lightheadedness may be a side effect of her BP med, so I suggested she take this at bedtime instead of in the morning. We will add Xanax to use 0.5 mg bid as needed.

## 2013-06-18 ENCOUNTER — Telehealth: Payer: Self-pay | Admitting: Family Medicine

## 2013-06-18 NOTE — Telephone Encounter (Signed)
Relevant patient education assigned to patient using Emmi. ° °

## 2013-10-07 ENCOUNTER — Ambulatory Visit (INDEPENDENT_AMBULATORY_CARE_PROVIDER_SITE_OTHER): Payer: 59 | Admitting: Family Medicine

## 2013-10-07 ENCOUNTER — Encounter: Payer: Self-pay | Admitting: Family Medicine

## 2013-10-07 ENCOUNTER — Telehealth: Payer: Self-pay | Admitting: Family Medicine

## 2013-10-07 VITALS — BP 108/81 | HR 99 | Temp 98.8°F | Ht 68.25 in | Wt 240.0 lb

## 2013-10-07 DIAGNOSIS — I10 Essential (primary) hypertension: Secondary | ICD-10-CM

## 2013-10-07 DIAGNOSIS — E669 Obesity, unspecified: Secondary | ICD-10-CM

## 2013-10-07 MED ORDER — AMLODIPINE BESY-BENAZEPRIL HCL 5-10 MG PO CAPS: 1.0000 | ORAL_CAPSULE | Freq: Every day | ORAL | Status: DC

## 2013-10-07 MED ORDER — PHENTERMINE HCL 37.5 MG PO CAPS: 37.5000 mg | ORAL_CAPSULE | ORAL | Status: DC

## 2013-10-07 NOTE — Progress Notes (Signed)
Pre visit review using our clinic review tool, if applicable. No additional management support is needed unless otherwise documented below in the visit note. 

## 2013-10-07 NOTE — Telephone Encounter (Signed)
Relevant patient education assigned to patient using Emmi. ° °

## 2013-10-07 NOTE — Progress Notes (Signed)
   Subjective:    Patient ID: Vanessa Hawkins, female    DOB: [birth year], 48 y.o.   MRN: [id number]  HPI Here to follow up. Her BP has been stable. Since we switched her dosing on Lotrel to evenings she has had fewer spells of lightheadedness, but this this still happens once or twice a week. She asks for help with her weight. She has changed her diet and she walks 2 days a week.    Review of Systems  Constitutional: Negative.   Respiratory: Negative.   Cardiovascular: Negative.        Objective:   Physical Exam  Constitutional: She appears well-developed and well-nourished.  Cardiovascular: Normal rate, regular rhythm, normal heart sounds and intact distal pulses.   Pulmonary/Chest: Effort normal and breath sounds normal.          Assessment & Plan:  We will decrease the Lotrel to 5-10 once a day. Try Phentermine daily for 6 months. Exercise every day and recheck in 3 months

## 2013-10-08 ENCOUNTER — Telehealth: Payer: Self-pay | Admitting: Family Medicine

## 2013-10-08 NOTE — Telephone Encounter (Signed)
Relevant patient education assigned to patient using Emmi. ° °

## 2014-01-01 ENCOUNTER — Encounter: Payer: Self-pay | Admitting: Family Medicine

## 2014-01-01 ENCOUNTER — Ambulatory Visit (INDEPENDENT_AMBULATORY_CARE_PROVIDER_SITE_OTHER): Payer: 59 | Admitting: Family Medicine

## 2014-01-01 VITALS — BP 107/89 | HR 100 | Temp 98.3°F | Ht 68.25 in | Wt 237.0 lb

## 2014-01-01 DIAGNOSIS — E669 Obesity, unspecified: Secondary | ICD-10-CM

## 2014-01-01 DIAGNOSIS — I10 Essential (primary) hypertension: Secondary | ICD-10-CM

## 2014-01-01 MED ORDER — BENAZEPRIL HCL 10 MG PO TABS: 10.0000 mg | ORAL_TABLET | Freq: Every day | ORAL | Status: DC

## 2014-01-01 NOTE — Progress Notes (Signed)
   Subjective:    Patient ID: Vanessa Hawkins, female    DOB: Dec 11, 1965, 48 y.o.   MRN: [id number]  HPI Here to follow up. At the last visit we decreased the dose of her amlodipine-benazepril by half and her BP has remained well controlled. The lightheaded spells she described have improved but she still has a few. Most of these sound to be orthostatic in nature. She took phentermine briefly but then stopped, and now she is focusing on eating a healthier diet.    Review of Systems  Constitutional: Negative.   Respiratory: Negative.   Cardiovascular: Negative.   Neurological: Positive for light-headedness. Negative for dizziness, syncope and weakness.       Objective:   Physical Exam  Constitutional: She is oriented to person, place, and time. She appears well-developed and well-nourished.  Cardiovascular: Normal rate, regular rhythm, normal heart sounds and intact distal pulses.   Pulmonary/Chest: Effort normal and breath sounds normal.  Neurological: She is alert and oriented to person, place, and time.          Assessment & Plan:  We will take away the amlodipine and switch to Benazepril 10 mg daily alone. Recheck prn

## 2014-01-01 NOTE — Progress Notes (Signed)
Pre visit review using our clinic review tool, if applicable. No additional management support is needed unless otherwise documented below in the visit note. 

## 2014-01-07 ENCOUNTER — Ambulatory Visit: Payer: 59 | Admitting: Family Medicine

## 2014-01-07 ENCOUNTER — Encounter: Payer: Self-pay | Admitting: Family Medicine

## 2014-01-09 ENCOUNTER — Ambulatory Visit: Payer: 59 | Admitting: Family Medicine

## 2014-01-09 ENCOUNTER — Other Ambulatory Visit: Payer: Self-pay | Admitting: Family Medicine

## 2014-01-09 NOTE — Telephone Encounter (Signed)
Stop the new BP med also. Instead try Metoprolol succinate 25 mg daily, call in 90 days supply. Call us back in 2 weeks with a report

## 2014-01-10 ENCOUNTER — Encounter: Payer: Self-pay | Admitting: Family Medicine

## 2014-01-10 ENCOUNTER — Telehealth: Payer: Self-pay | Admitting: Family Medicine

## 2014-01-10 MED ORDER — METOPROLOL SUCCINATE ER 25 MG PO TB24: 25.0000 mg | ORAL_TABLET | Freq: Every day | ORAL | Status: DC

## 2014-01-10 NOTE — Telephone Encounter (Signed)
I had to update medication chart.

## 2014-01-10 NOTE — Telephone Encounter (Signed)
Call in #90 with one rf 

## 2014-01-10 NOTE — Telephone Encounter (Signed)
This is a FYI.

## 2014-02-04 ENCOUNTER — Telehealth: Payer: Self-pay | Admitting: Family Medicine

## 2014-02-04 NOTE — Telephone Encounter (Signed)
Patient would like a re-fill on  Cephalexin 500 mg, take one 4 times per day. She states [name] prescribed it to her on 01/01/14 for an infection in her finger.  The infection appears to be re-occurring.    [facility] in [place].

## 2014-02-04 NOTE — Telephone Encounter (Signed)
Call in Keflex 500 mg QID for 10 days  

## 2014-02-05 MED ORDER — CEPHALEXIN 500 MG PO CAPS: 500.0000 mg | ORAL_CAPSULE | Freq: Four times a day (QID) | ORAL | Status: DC

## 2014-02-05 NOTE — Telephone Encounter (Signed)
I sent script e-scribe and spoke with pt. 

## 2014-05-12 ENCOUNTER — Other Ambulatory Visit: Payer: Self-pay | Admitting: Family Medicine

## 2014-06-13 ENCOUNTER — Other Ambulatory Visit (INDEPENDENT_AMBULATORY_CARE_PROVIDER_SITE_OTHER): Payer: 59

## 2014-06-13 DIAGNOSIS — Z0000 Encounter for general adult medical examination without abnormal findings: Secondary | ICD-10-CM

## 2014-06-13 LAB — CBC WITH DIFFERENTIAL/PLATELET
Basophils Absolute: 0 10*3/uL (ref 0.0–0.1)
Basophils Relative: 0.5 % (ref 0.0–3.0)
Eosinophils Absolute: 0.1 10*3/uL (ref 0.0–0.7)
Eosinophils Relative: 1.9 % (ref 0.0–5.0)
HCT: 43.1 % (ref 36.0–46.0)
Hemoglobin: 14.8 g/dL (ref 12.0–15.0)
Lymphocytes Relative: 40.5 % (ref 12.0–46.0)
Lymphs Abs: 2 10*3/uL (ref 0.7–4.0)
MCHC: 34.3 g/dL (ref 30.0–36.0)
MCV: 94.6 fl (ref 78.0–100.0)
Monocytes Absolute: 0.3 10*3/uL (ref 0.1–1.0)
Monocytes Relative: 5.8 % (ref 3.0–12.0)
Neutro Abs: 2.5 10*3/uL (ref 1.4–7.7)
Neutrophils Relative %: 51.3 % (ref 43.0–77.0)
Platelets: 328 10*3/uL (ref 150.0–400.0)
RBC: 4.56 Mil/uL (ref 3.87–5.11)
RDW: 14.4 % (ref 11.5–15.5)
WBC: 4.9 10*3/uL (ref 4.0–10.5)

## 2014-06-13 LAB — HEPATIC FUNCTION PANEL
ALT: 17 U/L (ref 0–35)
AST: 16 U/L (ref 0–37)
Albumin: 4.4 g/dL (ref 3.5–5.2)
Alkaline Phosphatase: 114 U/L (ref 39–117)
Bilirubin, Direct: 0 mg/dL (ref 0.0–0.3)
Total Bilirubin: 0.5 mg/dL (ref 0.2–1.2)
Total Protein: 7.6 g/dL (ref 6.0–8.3)

## 2014-06-13 LAB — LIPID PANEL
Cholesterol: 208 mg/dL — ABNORMAL HIGH (ref 0–200)
HDL: 53.7 mg/dL (ref >39.00)
LDL Cholesterol: 136 mg/dL — ABNORMAL HIGH (ref 0–99)
NonHDL: 154.3
Total CHOL/HDL Ratio: 4
Triglycerides: 91 mg/dL (ref 0.0–149.0)
VLDL: 18.2 mg/dL (ref 0.0–40.0)

## 2014-06-13 LAB — POCT URINALYSIS DIPSTICK
Bilirubin, UA: NEGATIVE
Blood, UA: NEGATIVE
Glucose, UA: NEGATIVE
Ketones, UA: NEGATIVE
Leukocytes, UA: NEGATIVE
Nitrite, UA: NEGATIVE
Protein, UA: NEGATIVE
Spec Grav, UA: 1.01
Urobilinogen, UA: 0.2
pH, UA: 7

## 2014-06-13 LAB — BASIC METABOLIC PANEL
BUN: 5 mg/dL — ABNORMAL LOW (ref 6–23)
CO2: 30 mEq/L (ref 19–32)
Calcium: 9.7 mg/dL (ref 8.4–10.5)
Chloride: 103 mEq/L (ref 96–112)
Creatinine, Ser: 0.8 mg/dL (ref 0.40–1.20)
GFR: 98.29 mL/min (ref >60.00)
Glucose, Bld: 96 mg/dL (ref 70–99)
Potassium: 4 mEq/L (ref 3.5–5.1)
Sodium: 138 mEq/L (ref 135–145)

## 2014-06-13 LAB — TSH: TSH: 1.88 u[IU]/mL (ref 0.35–4.50)

## 2014-06-20 ENCOUNTER — Encounter: Payer: Self-pay | Admitting: Family Medicine

## 2014-06-20 ENCOUNTER — Ambulatory Visit (INDEPENDENT_AMBULATORY_CARE_PROVIDER_SITE_OTHER): Payer: 59 | Admitting: Family Medicine

## 2014-06-20 VITALS — BP 126/81 | HR 89 | Temp 98.5°F | Ht 68.25 in | Wt 239.0 lb

## 2014-06-20 DIAGNOSIS — Z0000 Encounter for general adult medical examination without abnormal findings: Secondary | ICD-10-CM

## 2014-06-20 MED ORDER — ALPRAZOLAM 0.5 MG PO TABS: 0.5000 mg | ORAL_TABLET | Freq: Two times a day (BID) | ORAL | Status: DC

## 2014-06-20 MED ORDER — ALBUTEROL SULFATE HFA 108 (90 BASE) MCG/ACT IN AERS: INHALATION_SPRAY | RESPIRATORY_TRACT | Status: DC

## 2014-06-20 MED ORDER — BUPROPION HCL ER (XL) 150 MG PO TB24: ORAL_TABLET | ORAL | Status: DC

## 2014-06-20 MED ORDER — BENAZEPRIL-HYDROCHLOROTHIAZIDE 10-12.5 MG PO TABS: 1.0000 | ORAL_TABLET | Freq: Every day | ORAL | Status: DC

## 2014-06-20 NOTE — Progress Notes (Signed)
Pre visit review using our clinic review tool, if applicable. No additional management support is needed unless otherwise documented below in the visit note. 

## 2014-06-20 NOTE — Progress Notes (Signed)
   Subjective:    Patient ID: Vanessa Hawkins, female    DOB: 12/11/1965, 49 y.o.   MRN: [id number]  HPI 49 yr old female for a cpx. She feels well but has some concerns about her BP. Lately this has been well controlled in the mornings but goes up a bit in the evenings. She gets a lot of diastolic readings in the 11H. Her LDL has increased a little as well, and she attributes this to eating more pizza lately.    Review of Systems  Constitutional: Negative.   HENT: Negative.   Eyes: Negative.   Respiratory: Negative.   Cardiovascular: Negative.   Gastrointestinal: Negative.   Genitourinary: Negative for dysuria, urgency, frequency, hematuria, flank pain, decreased urine volume, enuresis, difficulty urinating, pelvic pain and dyspareunia.  Musculoskeletal: Negative.   Skin: Negative.   Neurological: Negative.   Psychiatric/Behavioral: Negative.        Objective:   Physical Exam  Constitutional: She is oriented to person, place, and time. She appears well-developed and well-nourished. No distress.  HENT:  Head: Normocephalic and atraumatic.  Right Ear: External ear normal.  Left Ear: External ear normal.  Nose: Nose normal.  Mouth/Throat: Oropharynx is clear and moist. No oropharyngeal exudate.  Eyes: Conjunctivae and EOM are normal. Pupils are equal, round, and reactive to light. No scleral icterus.  Neck: Normal range of motion. Neck supple. No JVD present. No thyromegaly present.  Cardiovascular: Normal rate, regular rhythm, normal heart sounds and intact distal pulses.  Exam reveals no gallop and no friction rub.   No murmur heard. Pulmonary/Chest: Effort normal and breath sounds normal. No respiratory distress. She has no wheezes. She has no rales. She exhibits no tenderness.  Abdominal: Soft. Bowel sounds are normal. She exhibits no distension and no mass. There is no tenderness. There is no rebound and no guarding.  Musculoskeletal: Normal range of motion. She exhibits  no edema or tenderness.  Lymphadenopathy:    She has no cervical adenopathy.  Neurological: She is alert and oriented to person, place, and time. She has normal reflexes. No cranial nerve deficit. She exhibits normal muscle tone. Coordination normal.  Skin: Skin is warm and dry. No rash noted. No erythema.  Psychiatric: She has a normal mood and affect. Her behavior is normal. Judgment and thought content normal.          Assessment & Plan:  Well exam. We discussed the changes she needs to make to her diet, she needs to exercise more and lose some weight. We will add HCTZ to her Benazepril and she will monitor her BP daily. She keeps up with yearly GYN exams.

## 2014-07-05 ENCOUNTER — Other Ambulatory Visit: Payer: Self-pay | Admitting: Family Medicine

## 2014-07-07 ENCOUNTER — Other Ambulatory Visit: Payer: Self-pay

## 2014-07-07 MED ORDER — FLUTICASONE PROPIONATE 50 MCG/ACT NA SUSP: 2.0000 | Freq: Every day | NASAL | Status: DC

## 2014-12-23 ENCOUNTER — Other Ambulatory Visit: Payer: Self-pay | Admitting: Family Medicine

## 2014-12-24 NOTE — Telephone Encounter (Signed)
Call in #60 with 5 rf 

## 2015-03-06 ENCOUNTER — Other Ambulatory Visit: Payer: Self-pay | Admitting: Family Medicine

## 2015-07-08 ENCOUNTER — Other Ambulatory Visit: Payer: Self-pay | Admitting: Family Medicine

## 2015-07-09 ENCOUNTER — Other Ambulatory Visit (INDEPENDENT_AMBULATORY_CARE_PROVIDER_SITE_OTHER): Payer: 59

## 2015-07-09 DIAGNOSIS — Z0000 Encounter for general adult medical examination without abnormal findings: Secondary | ICD-10-CM | POA: Diagnosis not present

## 2015-07-09 LAB — CBC WITH DIFFERENTIAL/PLATELET
Basophils Absolute: 0 10*3/uL (ref 0.0–0.1)
Basophils Relative: 0.7 % (ref 0.0–3.0)
Eosinophils Absolute: 0.1 10*3/uL (ref 0.0–0.7)
Eosinophils Relative: 2 % (ref 0.0–5.0)
HCT: 41.6 % (ref 36.0–46.0)
Hemoglobin: 14.1 g/dL (ref 12.0–15.0)
Lymphocytes Relative: 41.9 % (ref 12.0–46.0)
Lymphs Abs: 2 10*3/uL (ref 0.7–4.0)
MCHC: 33.9 g/dL (ref 30.0–36.0)
MCV: 94.4 fl (ref 78.0–100.0)
Monocytes Absolute: 0.3 10*3/uL (ref 0.1–1.0)
Monocytes Relative: 6.2 % (ref 3.0–12.0)
Neutro Abs: 2.4 10*3/uL (ref 1.4–7.7)
Neutrophils Relative %: 49.2 % (ref 43.0–77.0)
Platelets: 318 10*3/uL (ref 150.0–400.0)
RBC: 4.41 Mil/uL (ref 3.87–5.11)
RDW: 14.1 % (ref 11.5–15.5)
WBC: 4.8 10*3/uL (ref 4.0–10.5)

## 2015-07-09 LAB — LIPID PANEL
Cholesterol: 186 mg/dL (ref 0–200)
HDL: 48.1 mg/dL (ref >39.00)
LDL Cholesterol: 124 mg/dL — ABNORMAL HIGH (ref 0–99)
NonHDL: 137.95
Total CHOL/HDL Ratio: 4
Triglycerides: 72 mg/dL (ref 0.0–149.0)
VLDL: 14.4 mg/dL (ref 0.0–40.0)

## 2015-07-09 LAB — HEPATIC FUNCTION PANEL
ALT: 12 U/L (ref 0–35)
AST: 12 U/L (ref 0–37)
Albumin: 4.3 g/dL (ref 3.5–5.2)
Alkaline Phosphatase: 94 U/L (ref 39–117)
Bilirubin, Direct: 0.1 mg/dL (ref 0.0–0.3)
Total Bilirubin: 0.4 mg/dL (ref 0.2–1.2)
Total Protein: 6.8 g/dL (ref 6.0–8.3)

## 2015-07-09 LAB — BASIC METABOLIC PANEL
BUN: 9 mg/dL (ref 6–23)
CO2: 30 mEq/L (ref 19–32)
Calcium: 9.7 mg/dL (ref 8.4–10.5)
Chloride: 102 mEq/L (ref 96–112)
Creatinine, Ser: 0.84 mg/dL (ref 0.40–1.20)
GFR: 92.5 mL/min (ref >60.00)
Glucose, Bld: 94 mg/dL (ref 70–99)
Potassium: 4.4 mEq/L (ref 3.5–5.1)
Sodium: 139 mEq/L (ref 135–145)

## 2015-07-09 LAB — TSH: TSH: 1.47 u[IU]/mL (ref 0.35–4.50)

## 2015-07-09 LAB — POC URINALSYSI DIPSTICK (AUTOMATED)
Bilirubin, UA: NEGATIVE
Blood, UA: NEGATIVE
Glucose, UA: NEGATIVE
Ketones, UA: NEGATIVE
Leukocytes, UA: NEGATIVE
Nitrite, UA: NEGATIVE
Protein, UA: NEGATIVE
Spec Grav, UA: 1.015
Urobilinogen, UA: 0.2
pH, UA: 6.5

## 2015-07-10 ENCOUNTER — Ambulatory Visit (INDEPENDENT_AMBULATORY_CARE_PROVIDER_SITE_OTHER): Payer: 59 | Admitting: Family Medicine

## 2015-07-10 ENCOUNTER — Encounter: Payer: Self-pay | Admitting: Family Medicine

## 2015-07-10 VITALS — BP 115/83 | HR 90 | Temp 98.3°F | Ht 68.25 in | Wt 230.0 lb

## 2015-07-10 DIAGNOSIS — Z0000 Encounter for general adult medical examination without abnormal findings: Secondary | ICD-10-CM

## 2015-07-10 MED ORDER — ALPRAZOLAM 0.5 MG PO TABS: 0.5000 mg | ORAL_TABLET | Freq: Two times a day (BID) | ORAL | Status: DC

## 2015-07-10 NOTE — Progress Notes (Signed)
Pre visit review using our clinic review tool, if applicable. No additional management support is needed unless otherwise documented below in the visit note. 

## 2015-07-10 NOTE — Progress Notes (Signed)
   Subjective:    Patient ID: Vanessa Hawkins, female    DOB: 04-10-1965, 50 y.o.   MRN: [id number]  HPI 50 yr old female for a well exam. She feels well in general. She has lost about 10 lbs since her last visit here. She does ask me to check a lesion on the right thigh she noticed about 6 months ago. It does not bother her and it has not changed in appearance. Also for the past 3 months she has had intermittent urgency to urinate. No change in the overall volume of the urine but she often feels the urge to go even when no urine [place] produced. No burning or discomfort. No incontinence. Her UA last week was clear.   Review of Systems  Constitutional: Negative.   HENT: Negative.   Eyes: Negative.   Respiratory: Negative.   Cardiovascular: Negative.   Gastrointestinal: Negative.   Genitourinary: Positive for urgency. Negative for dysuria, frequency, hematuria, flank pain, decreased urine volume, enuresis, difficulty urinating, pelvic pain and dyspareunia.  Musculoskeletal: Negative.   Skin: Negative.   Neurological: Negative.   Psychiatric/Behavioral: Negative.        Objective:   Physical Exam  Constitutional: She is oriented to person, place, and time. She appears well-developed and well-nourished. No distress.  HENT:  Head: Normocephalic and atraumatic.  Right Ear: External ear normal.  Left Ear: External ear normal.  Nose: Nose normal.  Mouth/Throat: Oropharynx is clear and moist. No oropharyngeal exudate.  Eyes: Conjunctivae and EOM are normal. Pupils are equal, round, and reactive to light. No scleral icterus.  Neck: Normal range of motion. Neck supple. No JVD present. No thyromegaly present.  Cardiovascular: Normal rate, regular rhythm, normal heart sounds and intact distal pulses.  Exam reveals no gallop and no friction rub.   No murmur heard. Pulmonary/Chest: Effort normal and breath sounds normal. No respiratory distress. She has no wheezes. She has no rales. She exhibits  no tenderness.  Abdominal: Soft. Bowel sounds are normal. She exhibits no distension and no mass. There is no tenderness. There is no rebound and no guarding.  Musculoskeletal: Normal range of motion. She exhibits no edema or tenderness.  Lymphadenopathy:    She has no cervical adenopathy.  Neurological: She is alert and oriented to person, place, and time. She has normal reflexes. No cranial nerve deficit. She exhibits normal muscle tone. Coordination normal.  Skin: Skin is warm and dry. No rash noted. No erythema.  There is a 3 mm slightly raised light tan lesion on the anterior right thigh   Psychiatric: She has a normal mood and affect. Her behavior is normal. Judgment and thought content normal.          Assessment & Plan:  Well exam. We discussed diet and exercise. 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 She was reassured this is benign but she will monitor it. She seems to have some overactive bladder. She consumes a lot of caffeine so I advised her to decrease this. She declined treating this with medication at this time.  Laurey Morale, MD

## 2015-07-13 ENCOUNTER — Other Ambulatory Visit: Payer: Self-pay | Admitting: Family Medicine

## 2015-07-13 NOTE — Telephone Encounter (Signed)
Refill both for one year. 

## 2015-07-13 NOTE — Telephone Encounter (Signed)
Pt need refill on Rx Lotensin and Wellbutrin.   Pharm:  [facility] in [place]

## 2015-07-14 MED ORDER — BUPROPION HCL ER (XL) 150 MG PO TB24: ORAL_TABLET | ORAL | Status: DC

## 2015-07-14 MED ORDER — BENAZEPRIL-HYDROCHLOROTHIAZIDE 10-12.5 MG PO TABS: 1.0000 | ORAL_TABLET | Freq: Every day | ORAL | Status: DC

## 2015-07-14 NOTE — Telephone Encounter (Signed)
Rx done. 

## 2015-07-17 ENCOUNTER — Other Ambulatory Visit: Payer: Self-pay | Admitting: Family Medicine

## 2015-11-17 ENCOUNTER — Other Ambulatory Visit: Payer: Self-pay | Admitting: Family Medicine

## 2016-01-19 ENCOUNTER — Encounter: Payer: Self-pay | Admitting: Internal Medicine

## 2016-01-19 ENCOUNTER — Ambulatory Visit (INDEPENDENT_AMBULATORY_CARE_PROVIDER_SITE_OTHER): Payer: 59 | Admitting: Internal Medicine

## 2016-01-19 VITALS — BP 124/84 | Temp 98.2°F | Wt 225.2 lb

## 2016-01-19 DIAGNOSIS — R1310 Dysphagia, unspecified: Secondary | ICD-10-CM | POA: Diagnosis not present

## 2016-01-19 DIAGNOSIS — R0789 Other chest pain: Secondary | ICD-10-CM | POA: Diagnosis not present

## 2016-01-19 DIAGNOSIS — K219 Gastro-esophageal reflux disease without esophagitis: Secondary | ICD-10-CM

## 2016-01-19 MED ORDER — PANTOPRAZOLE SODIUM 40 MG PO TBEC: 40.0000 mg | DELAYED_RELEASE_TABLET | Freq: Every day | ORAL | 0 refills | Status: DC

## 2016-01-19 NOTE — Progress Notes (Signed)
Pre visit review using our clinic review tool, if applicable. No additional management support is needed unless otherwise documented below in the visit note. 

## 2016-01-19 NOTE — Patient Instructions (Addendum)
Take the PPI acid blocker every day preferably 30 minutes before eating. You can add ranitidine 150 mg at night medicine you are taking. Follow-up visit with [name] in 3 weeks or earlier if this is getting worse or not getting better. He will probably need a GI evaluation referral source often she'll problem. The tenderness at the xiphoid area may be [place] separate issue that isn't probably clinically significant.   Food Choices for Gastroesophageal Reflux Disease, Adult When you have gastroesophageal reflux disease (GERD), the foods you eat and your eating habits are very important. Choosing the right foods can help ease the discomfort of GERD. What general guidelines do I need to follow?  Choose fruits, vegetables, whole grains, low-fat dairy products, and low-fat meat, fish, and poultry.  Limit fats such as oils, salad dressings, butter, nuts, and avocado.  Keep a food diary to identify foods that cause symptoms.  Avoid foods that cause reflux. These may be different for different people.  Eat frequent small meals instead of three large meals each day.  Eat your meals slowly, in a relaxed setting.  Limit fried foods.  Cook foods using methods other than frying.  Avoid drinking alcohol.  Avoid drinking large amounts of liquids with your meals.  Avoid bending over or lying down until 2-3 hours after eating. What foods are not recommended? The following are some foods and drinks that may worsen your symptoms: Vegetables  Tomatoes. Tomato juice. Tomato and spaghetti sauce. Chili peppers. Onion and garlic. Horseradish. Fruits  Oranges, grapefruit, and lemon (fruit and juice). Meats  High-fat meats, fish, and poultry. This includes hot dogs, ribs, ham, sausage, salami, and bacon. Dairy  Whole milk and chocolate milk. Sour cream. Cream. Butter. Ice cream. Cream cheese. Beverages  Coffee and tea, with or without caffeine. Carbonated beverages or energy drinks. Condiments  Hot  sauce. Barbecue sauce. Sweets/Desserts  Chocolate and cocoa. Donuts. Peppermint and spearmint. Fats and Oils  High-fat foods, including [place] fries and potato chips. Other  Vinegar. Strong spices, such as black pepper, white pepper, red pepper, cayenne, curry powder, cloves, ginger, and chili powder. The items listed above may not be a complete list of foods and beverages to avoid. Contact your dietitian for more information.  This information is not intended to replace advice given to you by your health care provider. Make sure you discuss any questions you have with your health care provider. Document Released: 02/14/2005 Document Revised: 07/23/2015 Document Reviewed: 12/19/2012 Elsevier Interactive Patient Education  2017 [employer].

## 2016-01-19 NOTE — Progress Notes (Signed)
Chief Complaint  Patient presents with  . Knot below Sternum    Noticed 2-3 weeks ago.  When swallowing it feels like food is getting stuck at the area where the knot is.    HPI: Vanessa Hawkins 50 y.o.  sda   PCP NA today   A few Weeks ago  noted sore knot  When putting on lotions    Has chonric  Indigestion  And on zantac gavsicon as needed. At night   Recently   Feels hot and cold issues .  And feels At times food gets held up at the lower chest upper abdomen area. She does have water brash at times. There is no vomiting some weight loss over the last number of months unintentional. No breathing difficulties or trauma. She does states that remotely she did put on Protonix years ago. None recently. Doesn't remember choking on a specific pill or food per se and then have the start. No history of endoscopy. Friends told her that she could have a hiatal hernia.  ROS: See pertinent positives and negatives per HPI.  Past Medical History:  Diagnosis Date  . Allergy   . Asthma   . Depression   . Gynecological examination    sees [name]   . Hypertension     Family History  Problem Relation Age of Onset  . Diabetes    . Hyperlipidemia    . Hypertension    . Uterine cancer      Social History   Social History  . Marital status: Single    Spouse name: N/A  . Number of children: N/A  . Years of education: N/A   Social History Main Topics  . Smoking status: Current Every Day Smoker    Packs/day: 1.00    Years: 20.00    Types: Cigarettes  . Smokeless tobacco: Never Used  . Alcohol use 0.0 oz/week     Comment: occ  . Drug use: No  . Sexual activity: Not Asked   Other Topics Concern  . None   Social History Narrative  . None    Outpatient Medications Prior to Visit  Medication Sig Dispense Refill  . albuterol (VENTOLIN HFA) 108 (90 BASE) MCG/ACT inhaler INHALE TWO PUFFS EVERY 4 HOURS AS NEEDED 1 Inhaler 11  . ALPRAZolam (XANAX) 0.5 MG tablet Take 1  tablet (0.5 mg total) by mouth 2 (two) times daily. 60 tablet 5  . benazepril-hydrochlorthiazide (LOTENSIN HCT) 10-12.5 MG tablet Take 1 tablet by mouth daily. 30 tablet 11  . buPROPion (WELLBUTRIN XL) 150 MG 24 hr tablet TAKE ONE  BY MOUTH EVERY DAY 30 tablet 11  . fluticasone (FLONASE) 50 MCG/ACT nasal spray USE TWO SPRAY(S) IN EACH NOSTRIL ONCE DAILY 48 g 6  . hydrOXYzine (ATARAX/VISTARIL) 25 MG tablet TAKE ONE TABLET BY MOUTH THREE TIMES DAILY AS NEEDED 180 tablet 1  . Nutritional Supplements (ESTROVEN PO) Take by mouth daily.    Marland Kitchen estradiol (VIVELLE-DOT) 0.05 MG/24HR patch Place 1 patch onto the skin 2 (two) times a week.     No facility-administered medications prior to visit.      EXAM:  BP 124/84 (BP Location: Right Arm, Patient Position: Sitting, Cuff Size: Large)   Temp 98.2 F (36.8 C) (Oral)   Wt 225 lb 3.2 oz (102.2 kg)   BMI 33.99 kg/m   Body mass index is 33.99 kg/m.  GENERAL: vitals reviewed and listed above, alert, oriented, appears well hydrated and in no acute  distressNon-icteric. HEENT: atraumatic, conjunctiva  clear, no obvious abnormalities on inspection of external nose and ears OP : no lesion edema or exudate  NECK: no obvious masses on inspection palpation  LUNGS: clear to auscultation bilaterally, no wheezes, rales or rhonchi, good air movement Rib cage appears normal has a very prominent xiphoid process mildly tender no redness. Abdomen soft without obvious organomegaly healed gallbladder lap scars. No epigastric tenderness or rebound. CV: HRRR, no clubbing cyanosis or  peripheral edema nl cap refill  MS: moves all extremities without noticeable focal  abnormality PSYCH: pleasant and cooperative, no obvious depression or anxiety Wt Readings from Last 3 Encounters:  01/19/16 225 lb 3.2 oz (102.2 kg)  07/10/15 230 lb (104.3 kg)  06/20/14 239 lb (108.4 kg)    ASSESSMENT AND PLAN:  Discussed the following assessment and plan:  Dysphagia, unspecified  type  Gastroesophageal reflux disease, esophagitis presence not specified  Xiphoid pain Needs follow-up based on symptoms. Consider for endoscopy if not rapid improvement in her symptoms. Begin PPI and add on meds dietary changes and follow-up with PCP in a 3-4 weeks consider GI referral but would have PCP fine. She is due for routine colonoscopy screening also she just turned 50. revewied lalamr sx with her -Patient advised to return or notify health care team  if symptoms worsen ,persist or new concerns arise.  Patient Instructions  Take the PPI acid blocker every day preferably 30 minutes before eating. You can add ranitidine 150 mg at night medicine you are taking. Follow-up visit with [name] in 3 weeks or earlier if this is getting worse or not getting better. He will probably need a GI evaluation referral source often she'll problem. The tenderness at the xiphoid area may be [place] separate issue that isn't probably clinically significant.   Food Choices for Gastroesophageal Reflux Disease, Adult When you have gastroesophageal reflux disease (GERD), the foods you eat and your eating habits are very important. Choosing the right foods can help ease the discomfort of GERD. What general guidelines do I need to follow?  Choose fruits, vegetables, whole grains, low-fat dairy products, and low-fat meat, fish, and poultry.  Limit fats such as oils, salad dressings, butter, nuts, and avocado.  Keep a food diary to identify foods that cause symptoms.  Avoid foods that cause reflux. These may be different for different people.  Eat frequent small meals instead of three large meals each day.  Eat your meals slowly, in a relaxed setting.  Limit fried foods.  Cook foods using methods other than frying.  Avoid drinking alcohol.  Avoid drinking large amounts of liquids with your meals.  Avoid bending over or lying down until 2-3 hours after eating. What foods are not recommended? The  following are some foods and drinks that may worsen your symptoms: Vegetables  Tomatoes. Tomato juice. Tomato and spaghetti sauce. Chili peppers. Onion and garlic. Horseradish. Fruits  Oranges, grapefruit, and lemon (fruit and juice). Meats  High-fat meats, fish, and poultry. This includes hot dogs, ribs, ham, sausage, salami, and bacon. Dairy  Whole milk and chocolate milk. Sour cream. Cream. Butter. Ice cream. Cream cheese. Beverages  Coffee and tea, with or without caffeine. Carbonated beverages or energy drinks. Condiments  Hot sauce. Barbecue sauce. Sweets/Desserts  Chocolate and cocoa. Donuts. Peppermint and spearmint. Fats and Oils  High-fat foods, including [place] fries and potato chips. Other  Vinegar. Strong spices, such as black pepper, white pepper, red pepper, cayenne, curry powder, cloves, ginger, and chili powder.  The items listed above may not be a complete list of foods and beverages to avoid. Contact your dietitian for more information.  This information is not intended to replace advice given to you by your health care provider. Make sure you discuss any questions you have with your health care provider. Document Released: 02/14/2005 Document Revised: 07/23/2015 Document Reviewed: 12/19/2012 Elsevier Interactive Patient Education  2017 Faith K. Lititia Sen M.D.

## 2016-02-16 ENCOUNTER — Ambulatory Visit (INDEPENDENT_AMBULATORY_CARE_PROVIDER_SITE_OTHER): Payer: 59 | Admitting: Family Medicine

## 2016-02-16 ENCOUNTER — Encounter: Payer: Self-pay | Admitting: Family Medicine

## 2016-02-16 VITALS — BP 120/84 | Temp 98.0°F | Wt 225.8 lb

## 2016-02-16 DIAGNOSIS — H9211 Otorrhea, right ear: Secondary | ICD-10-CM

## 2016-02-16 DIAGNOSIS — Z23 Encounter for immunization: Secondary | ICD-10-CM | POA: Diagnosis not present

## 2016-02-16 DIAGNOSIS — K219 Gastro-esophageal reflux disease without esophagitis: Secondary | ICD-10-CM | POA: Insufficient documentation

## 2016-02-16 MED ORDER — PANTOPRAZOLE SODIUM 40 MG PO TBEC: 40.0000 mg | DELAYED_RELEASE_TABLET | Freq: Every day | ORAL | 3 refills | Status: DC

## 2016-02-16 NOTE — Progress Notes (Signed)
   Subjective:    Patient ID: Vanessa Hawkins, female    DOB: 1965/07/28, 50 y.o.   MRN: [id number]  HPI Here to follow up some GERD issues. She saw Dr. Regis Bill a few weeks ago for epigastric pains and a sensation of food stopping part way down. She has been taking Protonix daily and these symptoms have improved quite a bit. No nausea. Also she mentions itching and an annoying sloshing sensation in the right ear. No pain or hearing problems. She has had a PE tube in this ear for the past 5 years.   Review of Systems  Constitutional: Negative.   HENT: Positive for ear discharge. Negative for congestion, ear pain, hearing loss and sinus pain.   Eyes: Negative.   Respiratory: Negative.   Cardiovascular: Negative.   Gastrointestinal: Negative.        Objective:   Physical Exam  Constitutional: She appears well-developed and well-nourished.  HENT:  Left Ear: External ear normal.  Nose: Nose normal.  Mouth/Throat: Oropharynx is clear and moist.  Right ear canal has clear fluid in it. There is a PE tube that is partially out of the TM  Eyes: Conjunctivae are normal.  Neck: No thyromegaly present.  Cardiovascular: Normal rate, regular rhythm, normal heart sounds and intact distal pulses.   Pulmonary/Chest: Effort normal and breath sounds normal.  Abdominal: Soft. Bowel sounds are normal. She exhibits no distension and no mass. There is no tenderness. There is no rebound and no guarding.  Lymphadenopathy:    She has no cervical adenopathy.          Assessment & Plan:  She has had dysphagia as a result of GERD. She will stay on Protonix daily. The tube in te right ear needs to be removed because it is irritating her TM. We will refer her back to ENT for this.  Alysia Penna, MD

## 2016-02-16 NOTE — Progress Notes (Signed)
Pre visit review using our clinic review tool, if applicable. No additional management support is needed unless otherwise documented below in the visit note. 

## 2016-03-22 DIAGNOSIS — H6121 Impacted cerumen, right ear: Secondary | ICD-10-CM | POA: Diagnosis not present

## 2016-07-08 ENCOUNTER — Other Ambulatory Visit: Payer: Self-pay | Admitting: Family Medicine

## 2016-07-08 NOTE — Telephone Encounter (Signed)
Call in #60 with 5 rf 

## 2016-08-08 ENCOUNTER — Other Ambulatory Visit: Payer: Self-pay | Admitting: Family Medicine

## 2016-08-09 ENCOUNTER — Other Ambulatory Visit: Payer: Self-pay

## 2016-08-09 MED ORDER — BENAZEPRIL-HYDROCHLOROTHIAZIDE 10-12.5 MG PO TABS: 1.0000 | ORAL_TABLET | Freq: Every day | ORAL | 11 refills | Status: DC

## 2016-08-09 MED ORDER — BUPROPION HCL ER (XL) 150 MG PO TB24: 150.0000 mg | ORAL_TABLET | Freq: Every day | ORAL | 11 refills | Status: DC

## 2016-08-09 NOTE — Telephone Encounter (Signed)
Patient is scheduled for a CPE on 08/17/2016 and is requesting for refills please Advise if okey to refill.

## 2016-08-17 ENCOUNTER — Encounter: Payer: Self-pay | Admitting: Family Medicine

## 2016-08-17 ENCOUNTER — Ambulatory Visit (INDEPENDENT_AMBULATORY_CARE_PROVIDER_SITE_OTHER): Payer: 59 | Admitting: Family Medicine

## 2016-08-17 VITALS — BP 110/74 | Temp 98.3°F | Ht 68.25 in | Wt 218.0 lb

## 2016-08-17 DIAGNOSIS — Z0000 Encounter for general adult medical examination without abnormal findings: Secondary | ICD-10-CM

## 2016-08-17 LAB — CBC WITH DIFFERENTIAL/PLATELET
Basophils Absolute: 0 10*3/uL (ref 0.0–0.1)
Basophils Relative: 0.6 % (ref 0.0–3.0)
Eosinophils Absolute: 0.1 10*3/uL (ref 0.0–0.7)
Eosinophils Relative: 2.1 % (ref 0.0–5.0)
HCT: 42.1 % (ref 36.0–46.0)
Hemoglobin: 14.3 g/dL (ref 12.0–15.0)
Lymphocytes Relative: 40.7 % (ref 12.0–46.0)
Lymphs Abs: 1.6 10*3/uL (ref 0.7–4.0)
MCHC: 34 g/dL (ref 30.0–36.0)
MCV: 96.4 fl (ref 78.0–100.0)
Monocytes Absolute: 0.3 10*3/uL (ref 0.1–1.0)
Monocytes Relative: 6.5 % (ref 3.0–12.0)
Neutro Abs: 2 10*3/uL (ref 1.4–7.7)
Neutrophils Relative %: 50.1 % (ref 43.0–77.0)
Platelets: 310 10*3/uL (ref 150.0–400.0)
RBC: 4.37 Mil/uL (ref 3.87–5.11)
RDW: 13.4 % (ref 11.5–15.5)
WBC: 4 10*3/uL (ref 4.0–10.5)

## 2016-08-17 LAB — LIPID PANEL
Cholesterol: 205 mg/dL — ABNORMAL HIGH (ref 0–200)
HDL: 53 mg/dL (ref >39.00)
LDL Cholesterol: 139 mg/dL — ABNORMAL HIGH (ref 0–99)
NonHDL: 151.81
Total CHOL/HDL Ratio: 4
Triglycerides: 66 mg/dL (ref 0.0–149.0)
VLDL: 13.2 mg/dL (ref 0.0–40.0)

## 2016-08-17 LAB — POC URINALSYSI DIPSTICK (AUTOMATED)
Bilirubin, UA: NEGATIVE
Blood, UA: NEGATIVE
Glucose, UA: NEGATIVE
Ketones, UA: NEGATIVE
Leukocytes, UA: NEGATIVE
Nitrite, UA: NEGATIVE
Protein, UA: NEGATIVE
Spec Grav, UA: 1.015 (ref 1.010–1.025)
Urobilinogen, UA: 0.2 E.U./dL
pH, UA: 6 (ref 5.0–8.0)

## 2016-08-17 LAB — BASIC METABOLIC PANEL
BUN: 10 mg/dL (ref 6–23)
CO2: 29 mEq/L (ref 19–32)
Calcium: 9.7 mg/dL (ref 8.4–10.5)
Chloride: 104 mEq/L (ref 96–112)
Creatinine, Ser: 0.84 mg/dL (ref 0.40–1.20)
GFR: 92.08 mL/min (ref >60.00)
Glucose, Bld: 94 mg/dL (ref 70–99)
Potassium: 4.3 mEq/L (ref 3.5–5.1)
Sodium: 139 mEq/L (ref 135–145)

## 2016-08-17 LAB — HEPATIC FUNCTION PANEL
ALT: 10 U/L (ref 0–35)
AST: 14 U/L (ref 0–37)
Albumin: 4.4 g/dL (ref 3.5–5.2)
Alkaline Phosphatase: 89 U/L (ref 39–117)
Bilirubin, Direct: 0.1 mg/dL (ref 0.0–0.3)
Total Bilirubin: 0.4 mg/dL (ref 0.2–1.2)
Total Protein: 6.6 g/dL (ref 6.0–8.3)

## 2016-08-17 LAB — TSH: TSH: 1.33 u[IU]/mL (ref 0.35–4.50)

## 2016-08-17 MED ORDER — ALBUTEROL SULFATE HFA 108 (90 BASE) MCG/ACT IN AERS: INHALATION_SPRAY | RESPIRATORY_TRACT | 11 refills | Status: DC

## 2016-08-17 NOTE — Progress Notes (Signed)
   Subjective:    Patient ID: Vanessa Hawkins, female    DOB: 1965-03-25, 51 y.o.   MRN: [id number]  HPI 51 yr old female for a well exam. Her only complaint is chest discomfort which is probably due to GERD even though she takes a PPI regularly. She has been working on her diet and has lost 7 lbs.    Review of Systems  Constitutional: Negative.   HENT: Negative.   Eyes: Negative.   Respiratory: Negative.   Cardiovascular: Negative.   Gastrointestinal: Negative.   Genitourinary: Negative for decreased urine volume, difficulty urinating, dyspareunia, dysuria, enuresis, flank pain, frequency, hematuria, pelvic pain and urgency.  Musculoskeletal: Negative.   Skin: Negative.   Neurological: Negative.   Psychiatric/Behavioral: Negative.        Objective:   Physical Exam  Constitutional: She is oriented to person, place, and time. She appears well-developed and well-nourished. No distress.  HENT:  Head: Normocephalic and atraumatic.  Right Ear: External ear normal.  Left Ear: External ear normal.  Nose: Nose normal.  Mouth/Throat: Oropharynx is clear and moist. No oropharyngeal exudate.  Eyes: Conjunctivae and EOM are normal. Pupils are equal, round, and reactive to light. No scleral icterus.  Neck: Normal range of motion. Neck supple. No JVD present. No thyromegaly present.  Cardiovascular: Normal rate, regular rhythm, normal heart sounds and intact distal pulses.  Exam reveals no gallop and no friction rub.   No murmur heard. Pulmonary/Chest: Effort normal and breath sounds normal. No respiratory distress. She has no wheezes. She has no rales. She exhibits no tenderness.  Abdominal: Soft. Bowel sounds are normal. She exhibits no distension and no mass. There is no tenderness. There is no rebound and no guarding.  Musculoskeletal: Normal range of motion. She exhibits no edema or tenderness.  Lymphadenopathy:    She has no cervical adenopathy.  Neurological: She is alert and  oriented to person, place, and time. She has normal reflexes. No cranial nerve deficit. She exhibits normal muscle tone. Coordination normal.  Skin: Skin is warm and dry. No rash noted. No erythema.  Psychiatric: She has a normal mood and affect. Her behavior is normal. Judgment and thought content normal.          Assessment & Plan:  Well exam. We discussed diet and exercise. Get fasting labs. Refer to GI for a colonoscopy and possibly upper endoscopy.  Alysia Penna, MD

## 2016-08-17 NOTE — Patient Instructions (Signed)
WE NOW OFFER   [facility] FAST TRACK!!!  SAME DAY Appointments for ACUTE CARE  Such as: Sprains, Injuries, cuts, abrasions, rashes, muscle pain, joint pain, back pain Colds, flu, sore throats, headache, allergies, cough, fever  Ear pain, sinus and eye infections Abdominal pain, nausea, vomiting, diarrhea, upset stomach Animal/insect bites  3 Easy Ways to Schedule: Walk-In Scheduling Call in scheduling Mychart Sign-up: [email or web address]         

## 2016-08-23 DIAGNOSIS — Z1211 Encounter for screening for malignant neoplasm of colon: Secondary | ICD-10-CM | POA: Diagnosis not present

## 2016-08-23 DIAGNOSIS — K219 Gastro-esophageal reflux disease without esophagitis: Secondary | ICD-10-CM | POA: Diagnosis not present

## 2016-09-13 DIAGNOSIS — R12 Heartburn: Secondary | ICD-10-CM | POA: Diagnosis not present

## 2016-09-13 DIAGNOSIS — Z1211 Encounter for screening for malignant neoplasm of colon: Secondary | ICD-10-CM | POA: Diagnosis not present

## 2016-09-13 DIAGNOSIS — K2930 Chronic superficial gastritis without bleeding: Secondary | ICD-10-CM | POA: Diagnosis not present

## 2016-09-13 DIAGNOSIS — D126 Benign neoplasm of colon, unspecified: Secondary | ICD-10-CM | POA: Diagnosis not present

## 2016-09-13 HISTORY — PX: COLONOSCOPY: SHX174

## 2016-09-13 LAB — HM COLONOSCOPY

## 2016-09-29 ENCOUNTER — Encounter: Payer: Self-pay | Admitting: Family Medicine

## 2016-10-17 DIAGNOSIS — Z1231 Encounter for screening mammogram for malignant neoplasm of breast: Secondary | ICD-10-CM | POA: Diagnosis not present

## 2016-10-17 DIAGNOSIS — Z01419 Encounter for gynecological examination (general) (routine) without abnormal findings: Secondary | ICD-10-CM | POA: Diagnosis not present

## 2016-11-15 DIAGNOSIS — R6889 Other general symptoms and signs: Secondary | ICD-10-CM | POA: Diagnosis not present

## 2016-11-17 ENCOUNTER — Encounter: Payer: Self-pay | Admitting: Family Medicine

## 2016-12-24 ENCOUNTER — Other Ambulatory Visit: Payer: Self-pay | Admitting: Family Medicine

## 2017-03-30 DIAGNOSIS — H5213 Myopia, bilateral: Secondary | ICD-10-CM | POA: Diagnosis not present

## 2017-03-30 DIAGNOSIS — H52203 Unspecified astigmatism, bilateral: Secondary | ICD-10-CM | POA: Diagnosis not present

## 2017-05-09 DIAGNOSIS — H9203 Otalgia, bilateral: Secondary | ICD-10-CM | POA: Diagnosis not present

## 2017-05-09 DIAGNOSIS — H6983 Other specified disorders of Eustachian tube, bilateral: Secondary | ICD-10-CM | POA: Diagnosis not present

## 2017-06-11 ENCOUNTER — Other Ambulatory Visit: Payer: Self-pay | Admitting: Family Medicine

## 2017-06-13 NOTE — Telephone Encounter (Signed)
Call in #60 with 5 rf 

## 2017-06-13 NOTE — Telephone Encounter (Signed)
Last OV 08/17/2016   Last refilled 07/08/2016 disp 60 with 5 refills   Sent to PCP for approval

## 2017-06-14 DIAGNOSIS — H6983 Other specified disorders of Eustachian tube, bilateral: Secondary | ICD-10-CM | POA: Diagnosis not present

## 2017-06-14 DIAGNOSIS — J302 Other seasonal allergic rhinitis: Secondary | ICD-10-CM | POA: Diagnosis not present

## 2017-09-08 ENCOUNTER — Other Ambulatory Visit: Payer: Self-pay | Admitting: Family Medicine

## 2017-09-08 NOTE — Telephone Encounter (Signed)
Sent in a 30 day supply with no refills  Pt is due for an office visit for more refills

## 2017-09-26 ENCOUNTER — Ambulatory Visit (INDEPENDENT_AMBULATORY_CARE_PROVIDER_SITE_OTHER): Payer: 59 | Admitting: Family Medicine

## 2017-09-26 ENCOUNTER — Encounter: Payer: Self-pay | Admitting: Family Medicine

## 2017-09-26 VITALS — BP 124/86 | HR 90 | Temp 98.1°F | Ht 68.0 in | Wt 222.8 lb

## 2017-09-26 DIAGNOSIS — Z0000 Encounter for general adult medical examination without abnormal findings: Secondary | ICD-10-CM

## 2017-09-26 LAB — POC URINALSYSI DIPSTICK (AUTOMATED)
Bilirubin, UA: NEGATIVE
Blood, UA: NEGATIVE
Glucose, UA: NEGATIVE
Ketones, UA: NEGATIVE
Leukocytes, UA: NEGATIVE
Nitrite, UA: NEGATIVE
Protein, UA: NEGATIVE
Spec Grav, UA: 1.01 (ref 1.010–1.025)
Urobilinogen, UA: 0.2 E.U./dL
pH, UA: 6 (ref 5.0–8.0)

## 2017-09-26 MED ORDER — BENAZEPRIL-HYDROCHLOROTHIAZIDE 10-12.5 MG PO TABS: 1.0000 | ORAL_TABLET | Freq: Every day | ORAL | 11 refills | Status: DC

## 2017-09-26 MED ORDER — HYDROXYZINE HCL 25 MG PO TABS: 25.0000 mg | ORAL_TABLET | Freq: Three times a day (TID) | ORAL | 1 refills | Status: DC | PRN

## 2017-09-26 MED ORDER — BUPROPION HCL ER (XL) 150 MG PO TB24: 150.0000 mg | ORAL_TABLET | Freq: Every day | ORAL | 11 refills | Status: DC

## 2017-09-26 MED ORDER — ALBUTEROL SULFATE HFA 108 (90 BASE) MCG/ACT IN AERS: INHALATION_SPRAY | RESPIRATORY_TRACT | 11 refills | Status: DC

## 2017-09-26 NOTE — Progress Notes (Signed)
   Subjective:    Patient ID: Vanessa Hawkins, female    DOB: 30-Aug-1965, 52 y.o.   MRN: [id number]  HPI Here for a well exam. She feels fine. She has lost 30 lbs and she is vaping to help quit smoking.    Review of Systems  Constitutional: Negative.   HENT: Negative.   Eyes: Negative.   Respiratory: Negative.   Cardiovascular: Negative.   Gastrointestinal: Negative.   Genitourinary: Negative for decreased urine volume, difficulty urinating, dyspareunia, dysuria, enuresis, flank pain, frequency, hematuria, pelvic pain and urgency.  Musculoskeletal: Negative.   Skin: Negative.   Neurological: Negative.   Psychiatric/Behavioral: Negative.        Objective:   Physical Exam  Constitutional: She is oriented to person, place, and time. She appears well-developed and well-nourished. No distress.  HENT:  Head: Normocephalic and atraumatic.  Right Ear: External ear normal.  Left Ear: External ear normal.  Nose: Nose normal.  Mouth/Throat: Oropharynx is clear and moist. No oropharyngeal exudate.  Eyes: Pupils are equal, round, and reactive to light. Conjunctivae and EOM are normal. No scleral icterus.  Neck: Normal range of motion. Neck supple. No JVD present. No thyromegaly present.  Cardiovascular: Normal rate, regular rhythm, normal heart sounds and intact distal pulses. Exam reveals no gallop and no friction rub.  No murmur heard. Pulmonary/Chest: Effort normal and breath sounds normal. No respiratory distress. She has no wheezes. She has no rales. She exhibits no tenderness.  Abdominal: Soft. Bowel sounds are normal. She exhibits no distension and no mass. There is no tenderness. There is no rebound and no guarding.  Musculoskeletal: Normal range of motion. She exhibits no edema or tenderness.  Lymphadenopathy:    She has no cervical adenopathy.  Neurological: She is alert and oriented to person, place, and time. She has normal reflexes. She displays normal reflexes. No cranial  nerve deficit. She exhibits normal muscle tone. Coordination normal.  Skin: Skin is warm and dry. No rash noted. No erythema.  Psychiatric: She has a normal mood and affect. Her behavior is normal. Judgment and thought content normal.          Assessment & Plan:  Well exam. We discussed diet and exercise. Get fasting labs.  Vanessa Penna, MD

## 2017-09-27 LAB — BASIC METABOLIC PANEL
BUN: 7 mg/dL (ref 6–23)
CO2: 30 mEq/L (ref 19–32)
Calcium: 9.8 mg/dL (ref 8.4–10.5)
Chloride: 100 mEq/L (ref 96–112)
Creatinine, Ser: 0.81 mg/dL (ref 0.40–1.20)
GFR: 95.6 mL/min (ref >60.00)
Glucose, Bld: 93 mg/dL (ref 70–99)
Potassium: 3.9 mEq/L (ref 3.5–5.1)
Sodium: 137 mEq/L (ref 135–145)

## 2017-09-27 LAB — CBC WITH DIFFERENTIAL/PLATELET
Basophils Absolute: 0.1 10*3/uL (ref 0.0–0.1)
Basophils Relative: 2.2 % (ref 0.0–3.0)
Eosinophils Absolute: 0.1 10*3/uL (ref 0.0–0.7)
Eosinophils Relative: 1.4 % (ref 0.0–5.0)
HCT: 39.3 % (ref 36.0–46.0)
Hemoglobin: 13.5 g/dL (ref 12.0–15.0)
Lymphocytes Relative: 43.3 % (ref 12.0–46.0)
Lymphs Abs: 2.1 10*3/uL (ref 0.7–4.0)
MCHC: 34.3 g/dL (ref 30.0–36.0)
MCV: 98.8 fl (ref 78.0–100.0)
Monocytes Absolute: 0.3 10*3/uL (ref 0.1–1.0)
Monocytes Relative: 5.4 % (ref 3.0–12.0)
Neutro Abs: 2.3 10*3/uL (ref 1.4–7.7)
Neutrophils Relative %: 47.7 % (ref 43.0–77.0)
Platelets: 307 10*3/uL (ref 150.0–400.0)
RBC: 3.98 Mil/uL (ref 3.87–5.11)
RDW: 13.2 % (ref 11.5–15.5)
WBC: 4.9 10*3/uL (ref 4.0–10.5)

## 2017-09-27 LAB — LIPID PANEL
Cholesterol: 192 mg/dL (ref 0–200)
HDL: 66.4 mg/dL (ref >39.00)
LDL Cholesterol: 107 mg/dL — ABNORMAL HIGH (ref 0–99)
NonHDL: 125.93
Total CHOL/HDL Ratio: 3
Triglycerides: 93 mg/dL (ref 0.0–149.0)
VLDL: 18.6 mg/dL (ref 0.0–40.0)

## 2017-09-27 LAB — HEPATIC FUNCTION PANEL
ALT: 15 U/L (ref 0–35)
AST: 17 U/L (ref 0–37)
Albumin: 4.3 g/dL (ref 3.5–5.2)
Alkaline Phosphatase: 95 U/L (ref 39–117)
Bilirubin, Direct: 0 mg/dL (ref 0.0–0.3)
Total Bilirubin: 0.3 mg/dL (ref 0.2–1.2)
Total Protein: 7 g/dL (ref 6.0–8.3)

## 2017-09-27 LAB — TSH: TSH: 1.37 u[IU]/mL (ref 0.35–4.50)

## 2017-09-29 ENCOUNTER — Encounter: Payer: Self-pay | Admitting: Family Medicine

## 2017-09-29 ENCOUNTER — Telehealth: Payer: Self-pay | Admitting: Family Medicine

## 2017-09-29 NOTE — Telephone Encounter (Signed)
Pt returned call and lab results given to her with verbal understanding.  No result note found.

## 2017-09-29 NOTE — Telephone Encounter (Signed)
CRM with permission to give labs is in pt chart.

## 2017-09-29 NOTE — Telephone Encounter (Signed)
This encounter was created in error - please disregard.

## 2017-11-06 DIAGNOSIS — Z01419 Encounter for gynecological examination (general) (routine) without abnormal findings: Secondary | ICD-10-CM | POA: Diagnosis not present

## 2017-11-06 DIAGNOSIS — Z1231 Encounter for screening mammogram for malignant neoplasm of breast: Secondary | ICD-10-CM | POA: Diagnosis not present

## 2017-11-27 DIAGNOSIS — R6882 Decreased libido: Secondary | ICD-10-CM | POA: Diagnosis not present

## 2018-01-12 DIAGNOSIS — Z23 Encounter for immunization: Secondary | ICD-10-CM | POA: Diagnosis not present

## 2018-01-12 DIAGNOSIS — I10 Essential (primary) hypertension: Secondary | ICD-10-CM | POA: Diagnosis not present

## 2018-01-12 DIAGNOSIS — J0140 Acute pansinusitis, unspecified: Secondary | ICD-10-CM | POA: Diagnosis not present

## 2018-02-06 ENCOUNTER — Other Ambulatory Visit: Payer: Self-pay | Admitting: Family Medicine

## 2018-02-09 NOTE — Telephone Encounter (Signed)
Call in #60 with 5 rf 

## 2018-02-09 NOTE — Telephone Encounter (Signed)
[name] please advise on refill of the xanax. thanks

## 2018-02-12 DIAGNOSIS — H7292 Unspecified perforation of tympanic membrane, left ear: Secondary | ICD-10-CM | POA: Diagnosis not present

## 2018-02-12 DIAGNOSIS — H6983 Other specified disorders of Eustachian tube, bilateral: Secondary | ICD-10-CM | POA: Diagnosis not present

## 2018-02-12 NOTE — Telephone Encounter (Signed)
Refills called to the pharmacy

## 2018-05-31 DIAGNOSIS — M654 Radial styloid tenosynovitis [de Quervain]: Secondary | ICD-10-CM | POA: Diagnosis not present

## 2018-06-21 ENCOUNTER — Other Ambulatory Visit: Payer: Self-pay

## 2018-06-21 ENCOUNTER — Ambulatory Visit (INDEPENDENT_AMBULATORY_CARE_PROVIDER_SITE_OTHER): Payer: 59 | Admitting: Family Medicine

## 2018-06-21 ENCOUNTER — Encounter: Payer: Self-pay | Admitting: Family Medicine

## 2018-06-21 DIAGNOSIS — B351 Tinea unguium: Secondary | ICD-10-CM | POA: Diagnosis not present

## 2018-06-21 MED ORDER — TERBINAFINE HCL 250 MG PO TABS: 250.0000 mg | ORAL_TABLET | Freq: Every day | ORAL | 1 refills | Status: DC

## 2018-06-21 NOTE — Progress Notes (Signed)
Subjective:    Patient ID: Vanessa Hawkins, female    DOB: June 17, 1965, 53 y.o.   MRN: [id number]  HPI Virtual Visit via Video Note  I connected with the patient on 06/21/18 at 10:30 AM EDT by a video enabled telemedicine application and verified that I am speaking with the correct person using two identifiers.  Location patient: home Location provider:work or home office Persons participating in the virtual visit: patient, provider  I discussed the limitations of evaluation and management by telemedicine and the availability of in person appointments. The patient expressed understanding and agreed to proceed.   HPI: Here for fungus involvement in most of there toenails. This started about 2 years ago. She has tried some OTC remedies with no success.    ROS: See pertinent positives and negatives per HPI.  Past Medical History:  Diagnosis Date   Allergy    Asthma    Depression    Gynecological examination    sees [name]    Hypertension     Past Surgical History:  Procedure Laterality Date   ABDOMINAL HYSTERECTOMY     CARPAL TUNNEL RELEASE     CERVICAL Arial SURGERY     c4-5 dr elsner   CHOLECYSTECTOMY     COLONOSCOPY  09/13/2016   per Dr. Acquanetta Sit, adenomatous polyps, repeat in 5 years    ROTATOR CUFF REPAIR  01-18-12   left shoulder, per Dr. French Ana     Family History  Problem Relation Age of Onset   Diabetes Unknown    Hyperlipidemia Unknown    Hypertension Unknown    Uterine cancer Unknown      Current Outpatient Medications:    albuterol (VENTOLIN HFA) 108 (90 Base) MCG/ACT inhaler, INHALE TWO PUFFS EVERY 4 HOURS AS NEEDED, Disp: 1 Inhaler, Rfl: 11   ALPRAZolam (XANAX) 0.5 MG tablet, TAKE 1 TABLET BY MOUTH TWICE DAILY, Disp: 60 tablet, Rfl: 5   B Complex Vitamins (VITAMIN B COMPLEX PO), Take by mouth., Disp: , Rfl:    benazepril-hydrochlorthiazide (LOTENSIN HCT) 10-12.5 MG tablet, Take 1 tablet by mouth daily., Disp: 30  tablet, Rfl: 11   buPROPion (WELLBUTRIN XL) 150 MG 24 hr tablet, Take 1 tablet (150 mg total) by mouth daily., Disp: 30 tablet, Rfl: 11   fluticasone (FLONASE) 50 MCG/ACT nasal spray, USE TWO SPRAYS IN EACH NOSTRIL ONCE DAILY, Disp: 48 g, Rfl: 1   hydrOXYzine (ATARAX/VISTARIL) 25 MG tablet, Take 1 tablet (25 mg total) by mouth 3 (three) times daily as needed., Disp: 180 tablet, Rfl: 1   VIVELLE-DOT 0.1 MG/24HR patch, Place 1 patch on the skin twice weekly, Disp: , Rfl:    terbinafine (LAMISIL) 250 MG tablet, Take 1 tablet (250 mg total) by mouth daily., Disp: 90 tablet, Rfl: 1  EXAM:  VITALS per patient if applicable:  GENERAL: alert, oriented, appears well and in no acute distress  HEENT: atraumatic, conjunttiva clear, no obvious abnormalities on inspection of external nose and ears  NECK: normal movements of the head and neck  LUNGS: on inspection no signs of respiratory distress, breathing rate appears normal, no obvious gross SOB, gasping or wheezing  CV: no obvious cyanosis  MS: moves all visible extremities without noticeable abnormality  PSYCH/NEURO: pleasant and cooperative, no obvious depression or anxiety, speech and thought processing grossly intact  ASSESSMENT AND PLAN: Onychomycosis, treat with oral Terbinafine. Recheck prn.  Alysia Penna, MD  Discussed the following assessment and plan:  No diagnosis found.  I discussed the assessment and treatment plan with the patient. The patient was provided an opportunity to ask questions and all were answered. The patient agreed with the plan and demonstrated an understanding of the instructions.   The patient was advised to call back or seek an in-person evaluation if the symptoms worsen or if the condition fails to improve as anticipated.     Review of Systems     Objective:   Physical Exam        Assessment & Plan:

## 2018-08-12 ENCOUNTER — Other Ambulatory Visit: Payer: Self-pay | Admitting: Family Medicine

## 2018-10-03 ENCOUNTER — Other Ambulatory Visit: Payer: Self-pay

## 2018-10-03 ENCOUNTER — Telehealth (INDEPENDENT_AMBULATORY_CARE_PROVIDER_SITE_OTHER): Payer: 59 | Admitting: Family Medicine

## 2018-10-03 DIAGNOSIS — R339 Retention of urine, unspecified: Secondary | ICD-10-CM | POA: Diagnosis not present

## 2018-10-03 LAB — POCT URINALYSIS DIPSTICK
Appearance: NEGATIVE
Bilirubin, UA: NEGATIVE
Blood, UA: NEGATIVE
Glucose, UA: NEGATIVE
Ketones, UA: NEGATIVE
Leukocytes, UA: NEGATIVE
Nitrite, UA: NEGATIVE
Protein, UA: NEGATIVE
Spec Grav, UA: 1.01 (ref 1.010–1.025)
Urobilinogen, UA: 0.2 E.U./dL
pH, UA: 7 (ref 5.0–8.0)

## 2018-10-03 NOTE — Progress Notes (Signed)
This visit type was conducted due to national recommendations for restrictions regarding the COVID-19 pandemic in an effort to limit this patient's exposure and mitigate transmission in our community.   Virtual Visit via Video Note  I connected with Vanessa Hawkins on 10/03/18 at 10:45 AM EDT by a video enabled telemedicine application and verified that I am speaking with the correct person using two identifiers.  Location patient: home Location provider:work or home office Persons participating in the virtual visit: patient, provider  I discussed the limitations of evaluation and management by telemedicine and the availability of in person appointments. The patient expressed understanding and agreed to proceed.   HPI: Patient called to discuss some recent dysuria.  She has had about a week now of some urgency and "odor" to her urine.  No gross hematuria.  No fevers or chills.  No burning with urination.  She has also had to get up more at night- sometimes 3-4 times.  She also has had sensation of not being on empty her bladder fully.  After finishing urinating sometimes when she stands up she realizes she has not emptied the bladder and sometimes urinates several ounces more.  She does drink about 24 ounces of caffeine in the morning and then some after dinner.  No regular alcohol use.  She was concerned because her father's  being treated for bladder cancer.  Patient has not seen any gross hematuria.  She does have some sense of urgency but also sense of incomplete emptying as above.  Does not take any regular anticholinergic medications.   ROS: See pertinent positives and negatives per HPI.  Past Medical History:  Diagnosis Date  . Allergy   . Asthma   . Depression   . Gynecological examination    sees [name]   . Hypertension     Past Surgical History:  Procedure Laterality Date  . ABDOMINAL HYSTERECTOMY    . CARPAL TUNNEL RELEASE    . CERVICAL DISC SURGERY     c4-5 dr  Ellene Route  . CHOLECYSTECTOMY    . COLONOSCOPY  09/13/2016   per Dr. Acquanetta Sit, adenomatous polyps, repeat in 5 years   . ROTATOR CUFF REPAIR  01-18-12   left shoulder, per Dr. French Ana     Family History  Problem Relation Age of Onset  . Diabetes Unknown   . Hyperlipidemia Unknown   . Hypertension Unknown   . Uterine cancer Unknown     SOCIAL HX: Quit smoking last year   Current Outpatient Medications:  .  albuterol (VENTOLIN HFA) 108 (90 Base) MCG/ACT inhaler, INHALE TWO PUFFS EVERY 4 HOURS AS NEEDED, Disp: 1 Inhaler, Rfl: 11 .  ALPRAZolam (XANAX) 0.5 MG tablet, TAKE 1 TABLET BY MOUTH TWICE DAILY, Disp: 60 tablet, Rfl: 5 .  B Complex Vitamins (VITAMIN B COMPLEX PO), Take by mouth., Disp: , Rfl:  .  benazepril-hydrochlorthiazide (LOTENSIN HCT) 10-12.5 MG tablet, Take 1 tablet by mouth daily., Disp: 30 tablet, Rfl: 11 .  buPROPion (WELLBUTRIN XL) 150 MG 24 hr tablet, Take 1 tablet (150 mg total) by mouth daily., Disp: 30 tablet, Rfl: 11 .  fluticasone (FLONASE) 50 MCG/ACT nasal spray, USE TWO SPRAYS IN EACH NOSTRIL ONCE DAILY, Disp: 48 g, Rfl: 1 .  hydrOXYzine (ATARAX/VISTARIL) 25 MG tablet, Take 1 tablet by mouth three times daily as needed, Disp: 180 tablet, Rfl: 0 .  terbinafine (LAMISIL) 250 MG tablet, Take 1 tablet (250 mg total) by mouth daily., Disp: 90 tablet, Rfl: 1 .  VIVELLE-DOT 0.1 MG/24HR patch, Place 1 patch on the skin twice weekly, Disp: , Rfl:   EXAM:  VITALS per patient if applicable:  GENERAL: alert, oriented, appears well and in no acute distress  HEENT: atraumatic, conjunttiva clear, no obvious abnormalities on inspection of external nose and ears  NECK: normal movements of the head and neck  LUNGS: on inspection no signs of respiratory distress, breathing rate appears normal, no obvious gross SOB, gasping or wheezing  CV: no obvious cyanosis  MS: moves all visible extremities without noticeable abnormality  PSYCH/NEURO: pleasant and cooperative, no  obvious depression or anxiety, speech and thought processing grossly intact  ASSESSMENT AND PLAN:  Discussed the following assessment and plan:  Dysuria.  Rule out infection.  Patient also is describing some symptoms of intermittent urgency and possible incomplete emptying.  -Check urinalysis= clear.  No signs of infection and no blood -Gradually scale back caffeine use -Discussed possible medications for urine urgency but given the fact that she has concerns over incomplete emptying we will set up urology referral for further evaluation     I discussed the assessment and treatment plan with the patient. The patient was provided an opportunity to ask questions and all were answered. The patient agreed with the plan and demonstrated an understanding of the instructions.   The patient was advised to call back or seek an in-person evaluation if the symptoms worsen or if the condition fails to improve as anticipated.    Carolann Littler, MD

## 2018-10-05 LAB — URINE CULTURE
MICRO NUMBER:: 740011
SPECIMEN QUALITY:: ADEQUATE

## 2018-10-08 ENCOUNTER — Encounter: Payer: Self-pay | Admitting: Family Medicine

## 2018-10-23 ENCOUNTER — Other Ambulatory Visit: Payer: Self-pay | Admitting: Obstetrics and Gynecology

## 2018-10-23 DIAGNOSIS — N644 Mastodynia: Secondary | ICD-10-CM

## 2018-10-31 ENCOUNTER — Ambulatory Visit (INDEPENDENT_AMBULATORY_CARE_PROVIDER_SITE_OTHER): Payer: 59 | Admitting: Family Medicine

## 2018-10-31 ENCOUNTER — Encounter: Payer: Self-pay | Admitting: Family Medicine

## 2018-10-31 ENCOUNTER — Other Ambulatory Visit: Payer: Self-pay

## 2018-10-31 VITALS — BP 120/64 | HR 73 | Temp 98.3°F | Wt 242.6 lb

## 2018-10-31 DIAGNOSIS — R0602 Shortness of breath: Secondary | ICD-10-CM | POA: Diagnosis not present

## 2018-10-31 DIAGNOSIS — Z0000 Encounter for general adult medical examination without abnormal findings: Secondary | ICD-10-CM | POA: Diagnosis not present

## 2018-10-31 LAB — BASIC METABOLIC PANEL
BUN: 8 mg/dL (ref 6–23)
CO2: 33 mEq/L — ABNORMAL HIGH (ref 19–32)
Calcium: 9.9 mg/dL (ref 8.4–10.5)
Chloride: 98 mEq/L (ref 96–112)
Creatinine, Ser: 0.89 mg/dL (ref 0.40–1.20)
GFR: 80.34 mL/min (ref >60.00)
Glucose, Bld: 82 mg/dL (ref 70–99)
Potassium: 3.8 mEq/L (ref 3.5–5.1)
Sodium: 137 mEq/L (ref 135–145)

## 2018-10-31 LAB — LIPID PANEL
Cholesterol: 210 mg/dL — ABNORMAL HIGH (ref 0–200)
HDL: 65 mg/dL (ref >39.00)
LDL Cholesterol: 129 mg/dL — ABNORMAL HIGH (ref 0–99)
NonHDL: 145.1
Total CHOL/HDL Ratio: 3
Triglycerides: 80 mg/dL (ref 0.0–149.0)
VLDL: 16 mg/dL (ref 0.0–40.0)

## 2018-10-31 LAB — CBC WITH DIFFERENTIAL/PLATELET
Basophils Absolute: 0 10*3/uL (ref 0.0–0.1)
Basophils Relative: 0.7 % (ref 0.0–3.0)
Eosinophils Absolute: 0.1 10*3/uL (ref 0.0–0.7)
Eosinophils Relative: 2.9 % (ref 0.0–5.0)
HCT: 39.9 % (ref 36.0–46.0)
Hemoglobin: 13.4 g/dL (ref 12.0–15.0)
Lymphocytes Relative: 36.8 % (ref 12.0–46.0)
Lymphs Abs: 1.5 10*3/uL (ref 0.7–4.0)
MCHC: 33.5 g/dL (ref 30.0–36.0)
MCV: 91.7 fl (ref 78.0–100.0)
Monocytes Absolute: 0.3 10*3/uL (ref 0.1–1.0)
Monocytes Relative: 8.3 % (ref 3.0–12.0)
Neutro Abs: 2.1 10*3/uL (ref 1.4–7.7)
Neutrophils Relative %: 51.3 % (ref 43.0–77.0)
Platelets: 377 10*3/uL (ref 150.0–400.0)
RBC: 4.35 Mil/uL (ref 3.87–5.11)
RDW: 13.1 % (ref 11.5–15.5)
WBC: 4.1 10*3/uL (ref 4.0–10.5)

## 2018-10-31 LAB — HEPATIC FUNCTION PANEL
ALT: 14 U/L (ref 0–35)
AST: 14 U/L (ref 0–37)
Albumin: 4.5 g/dL (ref 3.5–5.2)
Alkaline Phosphatase: 111 U/L (ref 39–117)
Bilirubin, Direct: 0 mg/dL (ref 0.0–0.3)
Total Bilirubin: 0.3 mg/dL (ref 0.2–1.2)
Total Protein: 7.3 g/dL (ref 6.0–8.3)

## 2018-10-31 LAB — TSH: TSH: 1.21 u[IU]/mL (ref 0.35–4.50)

## 2018-10-31 MED ORDER — BUPROPION HCL ER (XL) 150 MG PO TB24: 150.0000 mg | ORAL_TABLET | Freq: Every day | ORAL | 11 refills | Status: DC

## 2018-10-31 MED ORDER — BENAZEPRIL-HYDROCHLOROTHIAZIDE 10-12.5 MG PO TABS: 1.0000 | ORAL_TABLET | Freq: Every day | ORAL | 11 refills | Status: DC

## 2018-10-31 MED ORDER — ALBUTEROL SULFATE HFA 108 (90 BASE) MCG/ACT IN AERS: INHALATION_SPRAY | RESPIRATORY_TRACT | 11 refills | Status: DC

## 2018-10-31 NOTE — Progress Notes (Signed)
Subjective:    Patient ID: Vanessa Hawkins, female    DOB: [birth year], 53 y.o.   MRN: [id number]  HPI Here for a well exam. She has several issues going on. She has been having some right breast pain and she saw Dr. Harrington Challenger recently for her GYN exam. She has been scheduled for a diagnostic mammogram. She has also been having urinary urgency, bladder pains, and incontinence so she saw Dr. McDiarmid at [facility]. She has been scheduled for a CT scan and a cystoscopy. The other issue she mentions to me is occasional SOB. She thinks this is due to her asthma but she is worried about her heart. There is no chest pain. She had a normal Myoview stress test in 2007.    Review of Systems  Constitutional: Negative.   HENT: Negative.   Eyes: Negative.   Respiratory: Positive for shortness of breath.   Cardiovascular: Negative.   Gastrointestinal: Negative.   Genitourinary: Positive for dysuria, frequency, pelvic pain and urgency. Negative for decreased urine volume, difficulty urinating, dyspareunia, enuresis, flank pain and hematuria.  Musculoskeletal: Negative.   Skin: Negative.   Neurological: Negative.   Psychiatric/Behavioral: Negative.        Objective:   Physical Exam Constitutional:      General: She is not in acute distress.    Appearance: She is well-developed.  HENT:     Head: Normocephalic and atraumatic.     Right Ear: External ear normal.     Left Ear: External ear normal.     Nose: Nose normal.     Mouth/Throat:     Pharynx: No oropharyngeal exudate.  Eyes:     General: No scleral icterus.    Conjunctiva/sclera: Conjunctivae normal.     Pupils: Pupils are equal, round, and reactive to light.  Neck:     Musculoskeletal: Normal range of motion and neck supple.     Thyroid: No thyromegaly.     Vascular: No JVD.  Cardiovascular:     Rate and Rhythm: Normal rate and regular rhythm.     Heart sounds: Normal heart sounds. No murmur. No friction rub. No gallop.     Comments: EKG is normal  Pulmonary:     Effort: Pulmonary effort is normal. No respiratory distress.     Breath sounds: Normal breath sounds. No wheezing or rales.  Chest:     Chest wall: No tenderness.  Abdominal:     General: Bowel sounds are normal. There is no distension.     Palpations: Abdomen is soft. There is no mass.     Tenderness: There is no abdominal tenderness. There is no guarding or rebound.  Musculoskeletal: Normal range of motion.        General: No tenderness.  Lymphadenopathy:     Cervical: No cervical adenopathy.  Skin:    General: Skin is warm and dry.     Findings: No erythema or rash.  Neurological:     Mental Status: She is alert and oriented to person, place, and time.     Cranial Nerves: No cranial nerve deficit.     Motor: No abnormal muscle tone.     Coordination: Coordination normal.     Deep Tendon Reflexes: Reflexes are normal and symmetric. Reflexes normal.  Psychiatric:        Behavior: Behavior normal.        Thought Content: Thought content normal.        Judgment: Judgment normal.  Assessment & Plan:  Well exam. We discussed diet and exercise. Get fasting labs. Her SOB is likely the result of some asthma. She will use her inhaler prn.  Alysia Penna, MD

## 2018-11-05 ENCOUNTER — Other Ambulatory Visit: Payer: Self-pay | Admitting: Family Medicine

## 2018-11-08 ENCOUNTER — Other Ambulatory Visit: Payer: Self-pay

## 2018-11-08 ENCOUNTER — Ambulatory Visit
Admission: RE | Admit: 2018-11-08 | Discharge: 2018-11-08 | Disposition: A | Discharge status: Home / Self Care | Payer: 59 | Source: Ambulatory Visit | Attending: Obstetrics and Gynecology | Admitting: Obstetrics and Gynecology

## 2018-11-08 ENCOUNTER — Ambulatory Visit: Payer: 59

## 2018-11-08 DIAGNOSIS — N644 Mastodynia: Secondary | ICD-10-CM

## 2018-11-13 ENCOUNTER — Encounter: Payer: Self-pay | Admitting: Family Medicine

## 2018-11-16 NOTE — Telephone Encounter (Signed)
I suggest we get a non-contrasted chest CT in 6 months to follow this, and we can go from there

## 2018-11-19 ENCOUNTER — Other Ambulatory Visit: Payer: Self-pay

## 2018-11-19 ENCOUNTER — Ambulatory Visit: Payer: 59 | Admitting: Pulmonary Disease

## 2018-11-19 ENCOUNTER — Encounter: Payer: Self-pay | Admitting: Pulmonary Disease

## 2018-11-19 VITALS — BP 136/80 | HR 100 | Ht 69.5 in | Wt 242.4 lb

## 2018-11-19 DIAGNOSIS — R911 Solitary pulmonary nodule: Secondary | ICD-10-CM | POA: Diagnosis not present

## 2018-11-19 MED ORDER — MONTELUKAST SODIUM 10 MG PO TABS: 10.0000 mg | ORAL_TABLET | Freq: Every day | ORAL | 3 refills | Status: DC

## 2018-11-19 NOTE — Progress Notes (Signed)
Subjective:     Patient ID: Vanessa Hawkins, female   DOB: [birth year], 53 y.o.   MRN: [id number]  Patient is being seen for an abnormal CT scan of the chest  CT shows a 6 mm nodule at the base of the lung She is asymptomatic Nodule was found incidentally  25-pack-year smoking history, quit about a year ago No contributory occupational history  Mother had cholangiocarcinoma Dad had kidney and bladder cancer-ongoing treatment  She has no personal history of cancer  She does have a history of asthma Uses rescue inhaler less than once a week  She was started on hydroxyzine many years back for stress-induced/stress associated asthma She is also on Xanax and Wellbutrin for depression  She does occasionally have significant symptoms related to seasonal changes She does use Flonase for nasal stuffiness and congestion  Past Medical History:  Diagnosis Date  . Allergy   . Asthma   . Depression   . Gynecological examination    sees [name]   . Hypertension    Social History   Socioeconomic History  . Marital status: Single    Spouse name: Not on file  . Number of children: Not on file  . Years of education: Not on file  . Highest education level: Not on file  Occupational History  . Not on file  Social Needs  . Financial resource strain: Not on file  . Food insecurity    Worry: Not on file    Inability: Not on file  . Transportation needs    Medical: Not on file    Non-medical: Not on file  Tobacco Use  . Smoking status: Former Smoker    Packs/day: 1.00    Years: 20.00    Pack years: 20.00    Types: E-cigarettes    Quit date: 09/01/2017    Years since quitting: 1.2  . Smokeless tobacco: Never Used  . Tobacco comment: currently vaping  Substance and Sexual Activity  . Alcohol use: Yes    Alcohol/week: 0.0 standard drinks    Comment: occ  . Drug use: No  . Sexual activity: Not on file  Lifestyle  . Physical activity    Days per week: Not on file     Minutes per session: Not on file  . Stress: Not on file  Relationships  . Social Herbalist on phone: Not on file    Gets together: Not on file    Attends religious service: Not on file    Active member of club or organization: Not on file    Attends meetings of clubs or organizations: Not on file    Relationship status: Not on file  . Intimate partner violence    Fear of current or ex partner: Not on file    Emotionally abused: Not on file    Physically abused: Not on file    Forced sexual activity: Not on file  Other Topics Concern  . Not on file  Social History Narrative  . Not on file   Family History  Problem Relation Age of Onset  . Diabetes Other   . Hyperlipidemia Other   . Hypertension Other   . Uterine cancer Other    Review of Systems  Constitutional: Negative for fever and unexpected weight change.  HENT: Negative for congestion, dental problem, ear pain, nosebleeds, postnasal drip, rhinorrhea, sinus pressure, sneezing, sore throat and trouble swallowing.   Eyes: Negative for redness and itching.  Respiratory: Positive  for shortness of breath. Negative for cough, chest tightness and wheezing.   Cardiovascular: Negative for palpitations and leg swelling.  Gastrointestinal: Negative for nausea and vomiting.  Genitourinary: Negative for dysuria.  Musculoskeletal: Negative for joint swelling.  Skin: Negative for rash.  Allergic/Immunologic: Positive for environmental allergies. Negative for food allergies and immunocompromised state.  Neurological: Negative for headaches.  Hematological: Does not bruise/bleed easily.  Psychiatric/Behavioral: Negative for dysphoric mood. The patient is nervous/anxious.        Objective:   Physical Exam Constitutional:      Appearance: Normal appearance.  HENT:     Head: Normocephalic and atraumatic.     Right Ear: Tympanic membrane normal.     Left Ear: Tympanic membrane normal.     Nose: No congestion.      Mouth/Throat:     Mouth: Mucous membranes are moist.  Eyes:     General:        Right eye: No discharge.        Left eye: No discharge.     Extraocular Movements: Extraocular movements intact.     Pupils: Pupils are equal, round, and reactive to light.  Neck:     Musculoskeletal: Normal range of motion and neck supple. No neck rigidity or muscular tenderness.     Vascular: No carotid bruit.  Cardiovascular:     Rate and Rhythm: Normal rate and regular rhythm.     Pulses: Normal pulses.     Heart sounds: Normal heart sounds. No murmur. No friction rub.  Pulmonary:     Effort: Pulmonary effort is normal. No respiratory distress.     Breath sounds: Normal breath sounds. No stridor. No wheezing or rhonchi.  Musculoskeletal: Normal range of motion.        General: No swelling.  Lymphadenopathy:     Cervical: No cervical adenopathy.  Skin:    General: Skin is warm and dry.  Neurological:     General: No focal deficit present.     Mental Status: She is alert.  Psychiatric:        Mood and Affect: Mood normal.    Vitals:   11/19/18 0950  BP: 136/80  Pulse: 100  SpO2: 99%     To report reviewed showing 6 mm nodule at the base of the lung Assessment:     Mild intermittent asthma -Seasonal symptoms -Having more symptoms recently with seasonal changes  Lung nodule -6 mm nodule at the base of the lung -Secondary to a significant smoking history, it is in the patient's best interest to have a full CT of the lung to rule out presence of any other significant finding  Reformed smoker  Obesity  Depression/anxiety     Plan:     Start on Singulair  Continue use of Flonase  Continue use of albuterol as needed  Discontinue hydroxyzine  Encouraged to call if there is increasing use of albuterol in which case she will need inhaled steroids  Weight loss efforts encouraged

## 2018-11-19 NOTE — Patient Instructions (Signed)
Lung nodule found incidentally at the base of the lung-68mm Reformed smoker Asthma  We will start you on Singulair Discontinue hydroxyzine  Call if you are having to use your rescue inhaler more often  I will see you back in the office in about 3 months We will call you following availability of your CT scan report

## 2018-12-04 ENCOUNTER — Other Ambulatory Visit: Payer: Self-pay

## 2018-12-04 ENCOUNTER — Ambulatory Visit (INDEPENDENT_AMBULATORY_CARE_PROVIDER_SITE_OTHER)
Admission: RE | Admit: 2018-12-04 | Discharge: 2018-12-04 | Disposition: A | Discharge status: Home / Self Care | Payer: 59 | Source: Ambulatory Visit | Attending: Pulmonary Disease | Admitting: Pulmonary Disease

## 2018-12-04 DIAGNOSIS — R911 Solitary pulmonary nodule: Secondary | ICD-10-CM

## 2018-12-10 ENCOUNTER — Other Ambulatory Visit: Payer: Self-pay | Admitting: Family Medicine

## 2018-12-13 NOTE — Telephone Encounter (Signed)
Okay for refill? Please advise 

## 2018-12-17 ENCOUNTER — Ambulatory Visit: Payer: 59

## 2018-12-24 ENCOUNTER — Other Ambulatory Visit: Payer: Self-pay

## 2018-12-24 ENCOUNTER — Ambulatory Visit (INDEPENDENT_AMBULATORY_CARE_PROVIDER_SITE_OTHER): Payer: 59 | Admitting: *Deleted

## 2018-12-24 DIAGNOSIS — Z23 Encounter for immunization: Secondary | ICD-10-CM | POA: Diagnosis not present

## 2019-02-18 ENCOUNTER — Ambulatory Visit: Payer: 59 | Admitting: Pulmonary Disease

## 2019-02-18 ENCOUNTER — Other Ambulatory Visit: Payer: Self-pay

## 2019-02-18 ENCOUNTER — Encounter: Payer: Self-pay | Admitting: Pulmonary Disease

## 2019-02-18 VITALS — BP 122/88 | HR 105 | Ht 69.5 in | Wt 236.0 lb

## 2019-02-18 DIAGNOSIS — R911 Solitary pulmonary nodule: Secondary | ICD-10-CM

## 2019-02-18 NOTE — Patient Instructions (Signed)
Hold off on Singulair for 2 to 4 weeks If you are not reaching for more albuterol, stay off of it If you return for more albuterol then get back on Singulair  We will repeat your CT in 6 months  I will see you back in the office in 6 months  Call with any significant concerns  Happy holidays

## 2019-02-18 NOTE — Progress Notes (Signed)
Subjective:     Patient ID: Vanessa Hawkins, female   DOB: 01-Jun-1965, 53 y.o.   MRN: [id number]  Patient is being seen for an abnormal CT scan of the chest  CT shows a 6 mm nodule at the base of the lung She is asymptomatic Nodule was found incidentally  Continues to do well Does not feel Singulair really helped much Uses albuterol about once to twice a week at most  underlying history of asthma  25-pack-year smoking history, quit about a year ago No contributory occupational history  Mother had cholangiocarcinoma Dad had kidney and bladder cancer-ongoing treatment  She has no personal history of cancer    She was started on hydroxyzine many years back for stress-induced/stress associated asthma She is also on Xanax and Wellbutrin for depression  She does occasionally have significant symptoms related to seasonal changes She does use Flonase for nasal stuffiness and congestion  Past Medical History:  Diagnosis Date  . Allergy   . Asthma   . Depression   . Gynecological examination    sees [name]   . Hypertension    Social History   Socioeconomic History  . Marital status: Single    Spouse name: Not on file  . Number of children: Not on file  . Years of education: Not on file  . Highest education level: Not on file  Occupational History  . Not on file  Tobacco Use  . Smoking status: Former Smoker    Packs/day: 1.00    Years: 20.00    Pack years: 20.00    Types: E-cigarettes    Quit date: 09/01/2017    Years since quitting: 1.4  . Smokeless tobacco: Never Used  . Tobacco comment: currently vaping  Substance and Sexual Activity  . Alcohol use: Yes    Alcohol/week: 0.0 standard drinks    Comment: occ  . Drug use: No  . Sexual activity: Not on file  Other Topics Concern  . Not on file  Social History Narrative  . Not on file   Social Determinants of Health   Financial Resource Strain:   . Difficulty of Paying Living Expenses: Not on file   Food Insecurity:   . Worried About Charity fundraiser in the Last Year: Not on file  . Ran Out of Food in the Last Year: Not on file  Transportation Needs:   . Lack of Transportation (Medical): Not on file  . Lack of Transportation (Non-Medical): Not on file  Physical Activity:   . Days of Exercise per Week: Not on file  . Minutes of Exercise per Session: Not on file  Stress:   . Feeling of Stress : Not on file  Social Connections:   . Frequency of Communication with Friends and Family: Not on file  . Frequency of Social Gatherings with Friends and Family: Not on file  . Attends Religious Services: Not on file  . Active Member of Clubs or Organizations: Not on file  . Attends Archivist Meetings: Not on file  . Marital Status: Not on file  Intimate Partner Violence:   . Fear of Current or Ex-Partner: Not on file  . Emotionally Abused: Not on file  . Physically Abused: Not on file  . Sexually Abused: Not on file   Family History  Problem Relation Age of Onset  . Diabetes Other   . Hyperlipidemia Other   . Hypertension Other   . Uterine cancer Other    Review  of Systems  Constitutional: Negative for fever and unexpected weight change.  HENT: Negative for congestion, dental problem, ear pain, nosebleeds, postnasal drip, rhinorrhea, sinus pressure, sneezing, sore throat and trouble swallowing.   Eyes: Negative for redness and itching.  Respiratory: Positive for shortness of breath. Negative for cough, chest tightness and wheezing.   Cardiovascular: Negative for palpitations and leg swelling.  Gastrointestinal: Negative for nausea and vomiting.  Genitourinary: Negative for dysuria.  Musculoskeletal: Negative for joint swelling.  Skin: Negative for rash.  Allergic/Immunologic: Positive for environmental allergies. Negative for food allergies and immunocompromised state.  Neurological: Negative for headaches.  Hematological: Does not bruise/bleed easily.   Psychiatric/Behavioral: Negative for dysphoric mood. The patient is not nervous/anxious.        Objective:   Physical Exam Constitutional:      Appearance: Normal appearance.  HENT:     Head: Normocephalic and atraumatic.     Right Ear: Tympanic membrane normal.     Left Ear: Tympanic membrane normal.     Nose: No congestion.     Mouth/Throat:     Mouth: Mucous membranes are moist.  Eyes:     General:        Right eye: No discharge.        Left eye: No discharge.     Extraocular Movements: Extraocular movements intact.     Pupils: Pupils are equal, round, and reactive to light.  Neck:     Vascular: No carotid bruit.  Cardiovascular:     Rate and Rhythm: Normal rate and regular rhythm.     Pulses: Normal pulses.     Heart sounds: Normal heart sounds. No murmur. No friction rub.  Pulmonary:     Effort: Pulmonary effort is normal. No respiratory distress.     Breath sounds: Normal breath sounds. No stridor. No wheezing or rhonchi.  Musculoskeletal:     Cervical back: Normal range of motion and neck supple. No rigidity. No muscular tenderness.  Lymphadenopathy:     Cervical: No cervical adenopathy.  Neurological:     General: No focal deficit present.     Mental Status: She is alert.  Psychiatric:        Mood and Affect: Mood normal.    Vitals:   02/18/19 0904  BP: 122/88  Pulse: (!) 105  SpO2: 97%     To report reviewed showing 6 mm nodule at the base of the lung Assessment:     Mild intermittent asthma -Seasonal symptoms -Having more symptoms recently with seasonal changes  Lung nodule -6 mm nodule at the base of the lung -Stable status we will repeat CT in 6 months  Reformed smoker  Obesity  Depression/anxiety     Plan:     Plan to hold Singulair  Continue use of Flonase  Continue use of albuterol as needed  Continue to hold hydroxyzine  Encouraged to call if there is increasing use of albuterol in which case she will need inhaled  steroids  I will see her back in 6 months CT to be repeated in 6 months Weight loss efforts encouraged

## 2019-03-14 ENCOUNTER — Other Ambulatory Visit: Payer: Self-pay | Admitting: Family Medicine

## 2019-03-14 NOTE — Telephone Encounter (Signed)
Last filled 12/17/2018 Last OV 10/31/2018  Ok to fill?

## 2019-03-21 ENCOUNTER — Other Ambulatory Visit: Payer: Self-pay | Admitting: Pulmonary Disease

## 2019-03-25 ENCOUNTER — Other Ambulatory Visit: Payer: Self-pay | Admitting: Pulmonary Disease

## 2019-04-17 ENCOUNTER — Ambulatory Visit: Payer: 59 | Admitting: Pulmonary Disease

## 2019-08-06 ENCOUNTER — Other Ambulatory Visit: Payer: Self-pay | Admitting: Pulmonary Disease

## 2019-08-08 ENCOUNTER — Other Ambulatory Visit: Payer: Self-pay

## 2019-08-08 ENCOUNTER — Ambulatory Visit (INDEPENDENT_AMBULATORY_CARE_PROVIDER_SITE_OTHER)
Admission: RE | Admit: 2019-08-08 | Discharge: 2019-08-08 | Disposition: A | Discharge status: Home / Self Care | Payer: 59 | Source: Ambulatory Visit | Attending: Pulmonary Disease | Admitting: Pulmonary Disease

## 2019-08-08 DIAGNOSIS — R911 Solitary pulmonary nodule: Secondary | ICD-10-CM

## 2019-08-15 ENCOUNTER — Encounter: Payer: Self-pay | Admitting: Pulmonary Disease

## 2019-08-15 ENCOUNTER — Other Ambulatory Visit: Payer: Self-pay

## 2019-08-15 ENCOUNTER — Ambulatory Visit: Payer: 59 | Admitting: Pulmonary Disease

## 2019-08-15 VITALS — BP 120/78 | HR 97 | Temp 97.9°F | Ht 69.0 in | Wt 228.0 lb

## 2019-08-15 DIAGNOSIS — J301 Allergic rhinitis due to pollen: Secondary | ICD-10-CM

## 2019-08-15 DIAGNOSIS — R911 Solitary pulmonary nodule: Secondary | ICD-10-CM

## 2019-08-15 MED ORDER — MONTELUKAST SODIUM 10 MG PO TABS: 10.0000 mg | ORAL_TABLET | Freq: Every day | ORAL | 5 refills | Status: DC

## 2019-08-15 NOTE — Patient Instructions (Addendum)
Stable lung nodule  Repeat in about 18 months to 2 years  You may discontinue Singulair  We will send in your prescriptions and to resume in the fall which is when you have most symptoms

## 2019-08-15 NOTE — Progress Notes (Signed)
Subjective:     Patient ID: Vanessa Hawkins, female   DOB: [birth year], 54 y.o.   MRN: [id number]  Follow-up abnormal CT scan of the chest  Denies any significant symptoms at present  CT shows a 6 mm nodule at the base of the lung She is asymptomatic Nodule was found incidentally  Continues to do well Singulair has helped a lot, has not needed to use albuterol much Uses albuterol about once to twice a week at most  underlying history of asthma  25-pack-year smoking history, quit about a year ago No contributory occupational history  Mother had cholangiocarcinoma Dad had kidney and bladder cancer-ongoing treatment  She has no personal history of cancer    She does use Flonase for nasal stuffiness and congestion  Past Medical History:  Diagnosis Date  . Allergy   . Asthma   . Depression   . Gynecological examination    sees [name]   . Hypertension    Social History   Socioeconomic History  . Marital status: Single    Spouse name: Not on file  . Number of children: Not on file  . Years of education: Not on file  . Highest education level: Not on file  Occupational History  . Not on file  Tobacco Use  . Smoking status: Former Smoker    Packs/day: 1.00    Years: 20.00    Pack years: 20.00    Types: E-cigarettes    Quit date: 09/01/2017    Years since quitting: 1.9  . Smokeless tobacco: Never Used  . Tobacco comment: currently vaping  Substance and Sexual Activity  . Alcohol use: Yes    Alcohol/week: 0.0 standard drinks    Comment: occ  . Drug use: No  . Sexual activity: Not on file  Other Topics Concern  . Not on file  Social History Narrative  . Not on file   Social Determinants of Health   Financial Resource Strain:   . Difficulty of Paying Living Expenses:   Food Insecurity:   . Worried About Charity fundraiser in the Last Year:   . Arboriculturist in the Last Year:   Transportation Needs:   . Film/video editor (Medical):   Marland Kitchen  Lack of Transportation (Non-Medical):   Physical Activity:   . Days of Exercise per Week:   . Minutes of Exercise per Session:   Stress:   . Feeling of Stress :   Social Connections:   . Frequency of Communication with Friends and Family:   . Frequency of Social Gatherings with Friends and Family:   . Attends Religious Services:   . Active Member of Clubs or Organizations:   . Attends Archivist Meetings:   Marland Kitchen Marital Status:   Intimate Partner Violence:   . Fear of Current or Ex-Partner:   . Emotionally Abused:   Marland Kitchen Physically Abused:   . Sexually Abused:    Family History  Problem Relation Age of Onset  . Diabetes Other   . Hyperlipidemia Other   . Hypertension Other   . Uterine cancer Other    Review of Systems  Constitutional: Negative for fever and unexpected weight change.  HENT: Negative for congestion, dental problem, ear pain, nosebleeds, postnasal drip, rhinorrhea, sinus pressure, sneezing, sore throat and trouble swallowing.   Eyes: Negative for redness and itching.  Respiratory: Negative for cough, chest tightness, shortness of breath and wheezing.   Cardiovascular: Negative for palpitations and leg swelling.  Gastrointestinal: Negative for nausea and vomiting.  Genitourinary: Negative for dysuria.  Musculoskeletal: Negative for joint swelling.  Skin: Negative for rash.  Allergic/Immunologic: Positive for environmental allergies. Negative for food allergies and immunocompromised state.  Neurological: Negative for headaches.  Hematological: Does not bruise/bleed easily.  Psychiatric/Behavioral: Negative for dysphoric mood. The patient is not nervous/anxious.        Objective:   Physical Exam Constitutional:      Appearance: Normal appearance.  HENT:     Head: Normocephalic and atraumatic.     Right Ear: Tympanic membrane normal.     Left Ear: Tympanic membrane normal.     Nose: No congestion.     Mouth/Throat:     Mouth: Mucous membranes are  moist.  Eyes:     General:        Right eye: No discharge.        Left eye: No discharge.     Extraocular Movements: Extraocular movements intact.     Pupils: Pupils are equal, round, and reactive to light.  Neck:     Vascular: No carotid bruit.  Cardiovascular:     Rate and Rhythm: Normal rate and regular rhythm.     Pulses: Normal pulses.     Heart sounds: Normal heart sounds. No murmur heard.  No friction rub.  Pulmonary:     Effort: Pulmonary effort is normal. No respiratory distress.     Breath sounds: Normal breath sounds. No stridor. No wheezing or rhonchi.  Musculoskeletal:     Cervical back: No rigidity. No muscular tenderness.  Lymphadenopathy:     Cervical: No cervical adenopathy.  Neurological:     Mental Status: She is alert.    Vitals:   08/15/19 0938  BP: 120/78  Pulse: 97  Temp: 97.9 F (36.6 C)  SpO2: 98%     To report reviewed showing 6 mm nodule at the base of the lung  CT scan of the chest reviewed with patient-performed 08/08/2019-stable nodule She was an active smoker up until a year ago so we will need repeat in about 18 months to 2 years Assessment:     Mild intermittent asthma -Seasonal symptoms -Having more symptoms recently with seasonal changes -Singulair seems to be helping greatly  Lung nodule -6 mm nodule at the base of the lung -Stable status we will repeat CT in 18 months to 2 years  Reformed smoker  Obesity  Depression/anxiety     Plan:     May hold Singulair as she is doing well  Continue use of Flonase  Albuterol as needed  Repeat CT in 18 months to 2 years Follow-up in a year from now

## 2019-09-03 ENCOUNTER — Telehealth: Payer: 59 | Admitting: Family

## 2019-09-03 DIAGNOSIS — R079 Chest pain, unspecified: Secondary | ICD-10-CM

## 2019-09-03 NOTE — Progress Notes (Signed)
Based on what you shared with me, I feel your condition warrants further evaluation and I recommend that you be seen for a face to face office visit.  Because of the sharp pain when you take a deep breath, it is safer to see you in person to confirm your suspicions or rule out anything more serious    NOTE: If you entered your credit card information for this eVisit, you will not be charged. You may see a "hold" on your card for the $35 but that hold will drop off and you will not have a charge processed.   If you are having a true medical emergency please call 911.      For an urgent face to face visit, [place] has five urgent care centers for your convenience:      NEW:  [facility] [place] at [place] Get Driving Directions [phone] [address] [place], [place] [address] . 10 am - 6pm Monday - Friday    [place] [facility]) Get Driving Directions [phone] [address] [place], [place] [address] . 10 am to 8 pm Monday-Friday . 12 pm to 8 pm [facility] at [facility] Get Driving Directions [phone] [address] [place], [place] [address] . 8 am to 8 pm Monday-Friday . 9 am to 6 pm Saturday . 11 am to 6 pm Sunday     [facility] at [facility] Get Driving Directions  [phone] [address] [place], [place] [address] . 8 am to 8 pm Monday-Friday . 8 am to 4 pm [facility]gent Care at [place] Get Driving Directions [phone] [address] [place],  [address] . 12 pm to 6 pm Monday-Friday      Your e-visit answers were reviewed by a board certified advanced clinical practitioner to complete your personal care plan.  Thank you for using e-Visits.

## 2019-09-10 ENCOUNTER — Ambulatory Visit: Payer: 59 | Admitting: Family Medicine

## 2019-09-10 ENCOUNTER — Other Ambulatory Visit: Payer: Self-pay

## 2019-09-10 ENCOUNTER — Encounter: Payer: Self-pay | Admitting: Family Medicine

## 2019-09-10 VITALS — BP 130/70 | HR 99 | Temp 98.6°F | Wt 226.0 lb

## 2019-09-10 DIAGNOSIS — M546 Pain in thoracic spine: Secondary | ICD-10-CM | POA: Diagnosis not present

## 2019-09-10 NOTE — Progress Notes (Signed)
   Subjective:    Patient ID: Vanessa Hawkins, female    DOB: 10-Aug-1965, 54 y.o.   MRN: [id number]  HPI Here for 3 weeks of a well localized sharp pain in the right upper back. It hurts to take a deep breath, but she is not SOB. No cough or fever. No recent trauma. Aleve helps the pain.    Review of Systems  Constitutional: Negative.   Respiratory: Negative.   Cardiovascular: Negative.   Musculoskeletal: Positive for back pain.       Objective:   Physical Exam Constitutional:      Appearance: Normal appearance. She is not ill-appearing.  Cardiovascular:     Rate and Rhythm: Normal rate and regular rhythm.     Pulses: Normal pulses.     Heart sounds: Normal heart sounds.  Pulmonary:     Effort: Pulmonary effort is normal.     Breath sounds: Normal breath sounds.  Musculoskeletal:     Comments: Mildly tender in the right upper back midway between the spine and the right scapula   Neurological:     Mental Status: She is alert.           Assessment & Plan:  Thoracic back pain, likely due to a pinched nerve. She can use heat and Aleve as needed. This should resolve soon. Alysia Penna, MD

## 2019-10-17 ENCOUNTER — Other Ambulatory Visit: Payer: Self-pay | Admitting: Family Medicine

## 2019-10-17 NOTE — Telephone Encounter (Signed)
Last filled 03/14/2019 Last OV 09/10/2019  Ok to fill?

## 2019-10-18 ENCOUNTER — Ambulatory Visit (HOSPITAL_COMMUNITY)
Admission: EM | Admit: 2019-10-18 | Discharge: 2019-10-18 | Disposition: A | Discharge status: Home / Self Care | Payer: 59 | Attending: Family Medicine | Admitting: Family Medicine

## 2019-10-18 ENCOUNTER — Encounter (HOSPITAL_COMMUNITY): Payer: Self-pay

## 2019-10-18 ENCOUNTER — Other Ambulatory Visit: Payer: Self-pay

## 2019-10-18 DIAGNOSIS — H66006 Acute suppurative otitis media without spontaneous rupture of ear drum, recurrent, bilateral: Secondary | ICD-10-CM

## 2019-10-18 MED ORDER — CIPRO HC 0.2-1 % OT SUSP
3.0000 [drp] | Freq: Two times a day (BID) | OTIC | 0 refills | Status: DC
Start: 2019-10-18 — End: 2019-12-26

## 2019-10-18 MED ORDER — AMOXICILLIN-POT CLAVULANATE 875-125 MG PO TABS
1.0000 | ORAL_TABLET | Freq: Two times a day (BID) | ORAL | 0 refills | Status: DC
Start: 2019-10-18 — End: 2019-12-26

## 2019-10-18 NOTE — ED Triage Notes (Signed)
Pt c/o bilateral ear pain for approx 10 days. Pt states h/o eustachian tube placement in right ear and "hole" in left ear. Pt states she got water in her ears approx 2 weeks ago and this past week, pain, brown/yellow drainage from both ears and decreased hearing. Also c/o pain to right side of face. Denies fever, chills, n/v/d, dizziness. Last took tylenol last night.

## 2019-10-18 NOTE — ED Provider Notes (Signed)
[place]    CSN: [id number] Arrival date & time: 10/18/19  0827      History   Chief Complaint Chief Complaint  Patient presents with  . Otalgia    HPI Vanessa Hawkins is a 54 y.o. female.   HPI  Very pleasant 54 year old woman.  Chronic severe allergies and problems with her ears.  She has had tubes many times.  She is under the care of Dr. Wilburn Cornelia at [facility].  She states that she has had ear pain for about 10 days right greater than left.  She has had increased drainage.  Decreased hearing.  No sinus congestion nasal congestion or runny nose.  No sore throat.  No fever.  She states that she has not had any dizziness or problems with balance.  She is taking Tylenol for pain.  She called her ENT and cannot be seen for a couple of weeks.  She states the pain is such that she does not feel like she can wait.  Past Medical History:  Diagnosis Date  . Allergy   . Asthma   . Depression   . Gynecological examination    sees [name]   . Hypertension     Patient Active Problem List   Diagnosis Date Noted  . Gastroesophageal reflux disease 02/16/2016  . Anxiety state, unspecified 06/17/2013  . Obesity 09/29/2008  . BACK PAIN 09/29/2008  . DEPRESSION 01/19/2007  . Essential hypertension 01/19/2007  . Allergic rhinitis 01/19/2007  . ASTHMA 01/19/2007  . ENLARGEMENT OF LYMPH NODES 01/19/2007  . CHICKENPOX, HX OF 01/19/2007    Past Surgical History:  Procedure Laterality Date  . ABDOMINAL HYSTERECTOMY    . CARPAL TUNNEL RELEASE    . CERVICAL DISC SURGERY     c4-5 dr Ellene Route  . CHOLECYSTECTOMY    . COLONOSCOPY  09/13/2016   per Dr. Acquanetta Sit, adenomatous polyps, repeat in 5 years   . ROTATOR CUFF REPAIR  01-18-12   left shoulder, per Dr. French Ana     OB History   No obstetric history on file.      Home Medications    Prior to Admission medications   Medication Sig Start Date End Date Taking? Authorizing Provider  albuterol (VENTOLIN  HFA) 108 (90 Base) MCG/ACT inhaler INHALE TWO PUFFS EVERY 4 HOURS AS NEEDED 10/31/18  Yes Laurey Morale, MD  albuterol (VENTOLIN HFA) 108 (90 Base) MCG/ACT inhaler albuterol sulfate HFA 90 mcg/actuation aerosol inhaler  INHALE 2 PUFFS BY MOUTH EVERY 4 HOURS AS NEEDED   Yes [provider]  ALPRAZolam Duanne Moron) 0.5 MG tablet Take 1 tablet by mouth twice daily 10/17/19  Yes Laurey Morale, MD  benazepril-hydrochlorthiazide (LOTENSIN HCT) 10-12.5 MG tablet Take 1 tablet by mouth daily. 10/31/18  Yes Laurey Morale, MD  buPROPion (WELLBUTRIN XL) 150 MG 24 hr tablet Take 1 tablet (150 mg total) by mouth daily. 10/31/18  Yes Laurey Morale, MD  fluticasone [facility]) 50 MCG/ACT nasal spray USE TWO SPRAYS IN [facility] NOSTRIL ONCE DAILY 12/26/16  Yes Laurey Morale, MD  montelukast (SINGULAIR) 10 MG tablet Take 1 tablet (10 mg total) by mouth at bedtime. 08/15/19  Yes Olalere, Adewale A, MD  amoxicillin-clavulanate (AUGMENTIN) 875-125 MG tablet Take 1 tablet by mouth every 12 (twelve) hours. 10/18/19   Raylene Everts, MD  ciprofloxacin-hydrocortisone (CIPRO [facility]) OTIC suspension Place 3 drops into both ears 2 (two) times daily. 10/18/19   Raylene Everts, MD  Family History Family History  Problem Relation Age of Onset  . Diabetes Other   . Hyperlipidemia Other   . Hypertension Other   . Uterine cancer Other     Social History Social History   Tobacco Use  . Smoking status: Former Smoker    Packs/day: 1.00    Years: 20.00    Pack years: 20.00    Types: E-cigarettes    Quit date: 09/01/2017    Years since quitting: 2.1  . Smokeless tobacco: Never Used  . Tobacco comment: currently vaping  Substance Use Topics  . Alcohol use: Yes    Alcohol/week: 0.0 standard drinks    Comment: occ  . Drug use: No     Allergies   Oxycodone-acetaminophen   Review of Systems Review of Systems See HPI  Physical Exam Triage Vital Signs ED Triage Vitals [10/18/19 0923]  Enc Vitals Group     BP  (!) 144/104     Pulse Rate 86     Resp 18     Temp 98.3 F (36.8 C)     Temp Source Oral     SpO2 97 %     Weight      Height      Head Circumference      Peak Flow      Pain Score 6     Pain Loc      Pain Edu?      Excl. in [place]?    No data found.  Updated Vital Signs BP (!) 144/104 (BP Location: Right Wrist) Comment: took OTC "sudafed" PTA  Pulse 86   Temp 98.3 F (36.8 C) (Oral)   Resp 18   SpO2 97%  Repeat blood pressure 120/94    Physical Exam Constitutional:      General: She is not in acute distress.    Appearance: She is well-developed and normal weight.  HENT:     Head: Normocephalic and atraumatic.     Ears:     Comments: Both TMs are red and dull.  Right TM has blue tympanostomy  Tube in place.  There is dark yellow/tan discharge in the ear canal. Eyes:     Conjunctiva/sclera: Conjunctivae normal.     Pupils: Pupils are equal, round, and reactive to light.  Cardiovascular:     Rate and Rhythm: Normal rate.  Pulmonary:     Effort: Pulmonary effort is normal. No respiratory distress.  Musculoskeletal:        General: Normal range of motion.     Cervical back: Normal range of motion.  Lymphadenopathy:     Cervical: Cervical adenopathy present.  Skin:    General: Skin is warm and dry.  Neurological:     Mental Status: She is alert.  Psychiatric:        Mood and Affect: Mood normal.        Behavior: Behavior normal.      UC Treatments / Results  Labs (all labs ordered are listed, but only abnormal results are displayed) Labs Reviewed - No data to display  EKG   Radiology No results found.  Procedures Procedures (including critical care time)  Medications Ordered in UC Medications - No data to display  Initial Impression / Assessment and Plan / UC Course  I have reviewed the triage vital signs and the nursing notes.  Pertinent labs & imaging results that were available during my care of the patient were reviewed by me and considered in  my medical decision  making (see chart for details).     Final Clinical Impressions(s) / UC Diagnoses   Final diagnoses:  Acute suppurative otitis media without spontaneous rupture of ear drum, recurrent, bilateral     Discharge Instructions     Take the Augmentin 2 x a day Consider a probiotic while on antibiotics Use ear drop 2 x a day See Dr Wilburn Cornelia in follow up   ED Prescriptions    Medication Sig Dispense Auth. Provider   amoxicillin-clavulanate (AUGMENTIN) 875-125 MG tablet Take 1 tablet by mouth every 12 (twelve) hours. 14 tablet Raylene Everts, MD   ciprofloxacin-hydrocortisone (CIPRO [facility]) OTIC suspension Place 3 drops into both ears 2 (two) times daily. 10 mL Raylene Everts, MD     PDMP not reviewed this encounter.   Raylene Everts, MD 10/18/19 1524

## 2019-10-18 NOTE — Discharge Instructions (Signed)
Take the Augmentin 2 x a day Consider a probiotic while on antibiotics Use ear drop 2 x a day See Dr Wilburn Cornelia in follow up

## 2019-11-01 ENCOUNTER — Other Ambulatory Visit: Payer: Self-pay | Admitting: Family Medicine

## 2019-12-12 ENCOUNTER — Encounter: Payer: Self-pay | Admitting: Family Medicine

## 2019-12-17 LAB — HM PAP SMEAR

## 2019-12-19 ENCOUNTER — Encounter: Payer: Self-pay | Admitting: Family Medicine

## 2019-12-19 ENCOUNTER — Other Ambulatory Visit: Payer: Self-pay | Admitting: Family Medicine

## 2019-12-20 ENCOUNTER — Encounter: Payer: 59 | Admitting: Family Medicine

## 2019-12-23 ENCOUNTER — Other Ambulatory Visit: Payer: Self-pay | Admitting: Obstetrics and Gynecology

## 2019-12-23 DIAGNOSIS — R928 Other abnormal and inconclusive findings on diagnostic imaging of breast: Secondary | ICD-10-CM

## 2019-12-24 ENCOUNTER — Other Ambulatory Visit: Payer: Self-pay

## 2019-12-24 NOTE — Telephone Encounter (Signed)
Received fax for refill request of benazepril-HCT and buproprion, both were sent in on 12/20/19 #30, patient needs an appointment, upcoming appointment 12/26/19. Request refused.

## 2019-12-26 ENCOUNTER — Other Ambulatory Visit: Payer: Self-pay

## 2019-12-26 ENCOUNTER — Encounter: Payer: Self-pay | Admitting: Family Medicine

## 2019-12-26 ENCOUNTER — Ambulatory Visit (INDEPENDENT_AMBULATORY_CARE_PROVIDER_SITE_OTHER): Payer: 59 | Admitting: Family Medicine

## 2019-12-26 VITALS — BP 116/70 | HR 87 | Temp 98.3°F | Ht 69.0 in | Wt 224.2 lb

## 2019-12-26 DIAGNOSIS — Z0000 Encounter for general adult medical examination without abnormal findings: Secondary | ICD-10-CM | POA: Diagnosis not present

## 2019-12-26 DIAGNOSIS — Z23 Encounter for immunization: Secondary | ICD-10-CM

## 2019-12-26 DIAGNOSIS — N3281 Overactive bladder: Secondary | ICD-10-CM | POA: Diagnosis not present

## 2019-12-26 MED ORDER — BENAZEPRIL-HYDROCHLOROTHIAZIDE 10-12.5 MG PO TABS: 1.0000 | ORAL_TABLET | Freq: Every day | ORAL | 3 refills | Status: DC

## 2019-12-26 MED ORDER — MONTELUKAST SODIUM 10 MG PO TABS: 10.0000 mg | ORAL_TABLET | Freq: Every day | ORAL | 3 refills | Status: DC

## 2019-12-26 MED ORDER — SOLIFENACIN SUCCINATE 5 MG PO TABS: 5.0000 mg | ORAL_TABLET | Freq: Every day | ORAL | 3 refills | Status: DC

## 2019-12-26 MED ORDER — BUPROPION HCL ER (XL) 150 MG PO TB24: ORAL_TABLET | ORAL | 3 refills | Status: DC

## 2019-12-26 NOTE — Progress Notes (Signed)
Subjective:    Patient ID: Vanessa Hawkins, female    DOB: 26-Dec-1965, 54 y.o.   MRN: [id number]  HPI Here for a well exam. She feels well but she does mention overactive bladder. Over the past year she has had urgency to urinate and frequency, but no burning or discomfort. Her urine has never shown infection. She saw Dr. McDiarmid at Urology and he diagnosed her with OAB. 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 She has been using Oxybutynin with mixed results. She plans to leave her current job tomorrow so she can help care for her father who has bladder cancer. 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  Review of Systems  Constitutional: Negative.  Negative for activity change, appetite change, diaphoresis, fatigue, fever and unexpected weight change.  HENT: Negative.  Negative for congestion, ear pain, hearing loss, nosebleeds, sore throat, tinnitus, trouble swallowing and voice change.   Eyes: Negative.  Negative for photophobia, pain, discharge, redness and visual disturbance.  Respiratory: Negative.  Negative for apnea, cough, choking, chest tightness, shortness of breath, wheezing and stridor.   Cardiovascular: Negative.  Negative for chest pain, palpitations and leg swelling.  Gastrointestinal: Negative.  Negative for abdominal distention, abdominal pain, blood in stool, constipation, diarrhea, nausea, rectal pain and vomiting.  Genitourinary: Negative.  Negative for difficulty urinating, dysuria, enuresis, flank pain, frequency, hematuria, menstrual problem, urgency, vaginal bleeding, vaginal discharge and vaginal pain.  Musculoskeletal: Negative.  Negative for arthralgias, back pain, gait problem, joint swelling, myalgias, neck pain and neck stiffness.  Skin: Negative.  Negative for color change, pallor, rash and wound.  Neurological: Negative.  Negative for dizziness, tremors, seizures, syncope, speech difficulty, weakness,  light-headedness, numbness and headaches.  Hematological: Negative for adenopathy. Does not bruise/bleed easily.  Psychiatric/Behavioral: Negative.  Negative for agitation, behavioral problems, confusion, dysphoric mood, hallucinations and sleep disturbance. The patient is not nervous/anxious.        Objective:   Physical Exam Constitutional:      General: She is not in acute distress.    Appearance: She is well-developed.  HENT:     Head: Normocephalic and atraumatic.     Right Ear: External ear normal.     Left Ear: External ear normal.     Nose: Nose normal.     Mouth/Throat:     Pharynx: No oropharyngeal exudate.  Eyes:     General: No scleral icterus.    Conjunctiva/sclera: Conjunctivae normal.     Pupils: Pupils are equal, round, and reactive to light.  Neck:     Thyroid: No thyromegaly.     Vascular: No JVD.  Cardiovascular:     Rate and Rhythm: Normal rate and regular rhythm.     Heart sounds: Normal heart sounds. No murmur heard.  No friction rub. No gallop.   Pulmonary:     Effort: Pulmonary effort is normal. No respiratory distress.     Breath sounds: Normal breath sounds. No wheezing or rales.  Chest:     Chest wall: No tenderness.  Abdominal:     General: Bowel sounds are normal. There is no distension.     Palpations: Abdomen is soft. There is no mass.     Tenderness: There is no abdominal tenderness. There is no guarding or rebound.  Musculoskeletal:        General: No tenderness. Normal range of motion.     Cervical back: Normal range of motion and neck  supple.  Lymphadenopathy:     Cervical: No cervical adenopathy.  Skin:    General: Skin is warm and dry.     Findings: No erythema or rash.  Neurological:     Mental Status: She is alert and oriented to person, place, and time.     Cranial Nerves: No cranial nerve deficit.     Motor: No abnormal muscle tone.     Coordination: Coordination normal.     Deep Tendon Reflexes: Reflexes are normal and  symmetric. Reflexes normal.  Psychiatric:        Behavior: Behavior normal.        Thought Content: Thought content normal.        Judgment: Judgment normal.           Assessment & Plan:  Well exam. We discussed diet and exercise. Get fasting labs. For the OAB she will try Vesicare 5 mg daily.  Alysia Penna, MD

## 2019-12-27 LAB — CBC WITH DIFFERENTIAL/PLATELET
Absolute Monocytes: 320 cells/uL (ref 200–950)
Basophils Absolute: 20 cells/uL (ref 0–200)
Basophils Relative: 0.5 %
Eosinophils Absolute: 80 cells/uL (ref 15–500)
Eosinophils Relative: 2 %
HCT: 39.9 % (ref 35.0–45.0)
Hemoglobin: 13.2 g/dL (ref 11.7–15.5)
Lymphs Abs: 1400 cells/uL (ref 850–3900)
MCH: 31.2 pg (ref 27.0–33.0)
MCHC: 33.1 g/dL (ref 32.0–36.0)
MCV: 94.3 fL (ref 80.0–100.0)
MPV: 8.9 fL (ref 7.5–12.5)
Monocytes Relative: 8 %
Neutro Abs: 2180 cells/uL (ref 1500–7800)
Neutrophils Relative %: 54.5 %
Platelets: 344 10*3/uL (ref 140–400)
RBC: 4.23 10*6/uL (ref 3.80–5.10)
RDW: 12.7 % (ref 11.0–15.0)
Total Lymphocyte: 35 %
WBC: 4 10*3/uL (ref 3.8–10.8)

## 2019-12-27 LAB — HEPATIC FUNCTION PANEL
AG Ratio: 1.6 (calc) (ref 1.0–2.5)
ALT: 12 U/L (ref 6–29)
AST: 16 U/L (ref 10–35)
Albumin: 4.4 g/dL (ref 3.6–5.1)
Alkaline phosphatase (APISO): 108 U/L (ref 37–153)
Bilirubin, Direct: 0.1 mg/dL (ref 0.0–0.2)
Globulin: 2.7 g/dL (calc) (ref 1.9–3.7)
Indirect Bilirubin: 0.3 mg/dL (calc) (ref 0.2–1.2)
Total Bilirubin: 0.4 mg/dL (ref 0.2–1.2)
Total Protein: 7.1 g/dL (ref 6.1–8.1)

## 2019-12-27 LAB — BASIC METABOLIC PANEL
BUN: 9 mg/dL (ref 7–25)
CO2: 27 mmol/L (ref 20–32)
Calcium: 9.7 mg/dL (ref 8.6–10.4)
Chloride: 100 mmol/L (ref 98–110)
Creat: 0.86 mg/dL (ref 0.50–1.05)
Glucose, Bld: 81 mg/dL (ref 65–99)
Potassium: 4.2 mmol/L (ref 3.5–5.3)
Sodium: 137 mmol/L (ref 135–146)

## 2019-12-27 LAB — LIPID PANEL
Cholesterol: 218 mg/dL — ABNORMAL HIGH (ref <200)
HDL: 69 mg/dL (ref >=50)
LDL Cholesterol (Calc): 133 mg/dL (calc) — ABNORMAL HIGH
Non-HDL Cholesterol (Calc): 149 mg/dL (calc) — ABNORMAL HIGH (ref <130)
Total CHOL/HDL Ratio: 3.2 (calc) (ref <5.0)
Triglycerides: 66 mg/dL (ref <150)

## 2019-12-27 LAB — TSH: TSH: 1.28 mIU/L

## 2020-01-06 ENCOUNTER — Encounter: Payer: Self-pay | Admitting: Family Medicine

## 2020-01-07 MED ORDER — OXYBUTYNIN CHLORIDE ER 10 MG PO TB24: 10.0000 mg | ORAL_TABLET | Freq: Every day | ORAL | 3 refills | Status: DC

## 2020-01-07 NOTE — Telephone Encounter (Signed)
Call in Oxybutynin XL 10 mg to take daily at bedtime, #90 with 3 rf

## 2020-01-10 ENCOUNTER — Ambulatory Visit
Admission: RE | Admit: 2020-01-10 | Discharge: 2020-01-10 | Disposition: A | Discharge status: Home / Self Care | Payer: 59 | Source: Ambulatory Visit | Attending: Obstetrics and Gynecology | Admitting: Obstetrics and Gynecology

## 2020-01-10 ENCOUNTER — Other Ambulatory Visit: Payer: Self-pay

## 2020-01-10 DIAGNOSIS — R928 Other abnormal and inconclusive findings on diagnostic imaging of breast: Secondary | ICD-10-CM

## 2020-05-04 ENCOUNTER — Other Ambulatory Visit: Payer: Self-pay | Admitting: Family Medicine

## 2020-05-04 NOTE — Telephone Encounter (Signed)
Last refill- 10/17/2019--60 tabs-1 tab po bid---5 refills Last office visit- 12/26/2019  No future appointment has been scheduled

## 2020-09-29 ENCOUNTER — Telehealth: Payer: 59 | Admitting: Physician Assistant

## 2020-09-29 DIAGNOSIS — R21 Rash and other nonspecific skin eruption: Secondary | ICD-10-CM

## 2020-09-29 MED ORDER — TRIAMCINOLONE ACETONIDE 0.1 % EX CREA: 1.0000 "application " | TOPICAL_CREAM | Freq: Every day | CUTANEOUS | 0 refills | Status: DC

## 2020-09-29 NOTE — Progress Notes (Signed)
Virtual Visit Consent   FLORASTINE MECKES, you are scheduled for a virtual visit with a [facility] provider today.     Just as with appointments in the office, your consent must be obtained to participate.  Your consent will be active for this visit and any virtual visit you may have with one of our providers in the next 365 days.     If you have a MyChart account, a copy of this consent can be sent to you electronically.  All virtual visits are billed to your insurance company just like a traditional visit in the office.    As this is a virtual visit, video technology does not allow for your provider to perform a traditional examination.  This may limit your provider's ability to fully assess your condition.  If your provider identifies any concerns that need to be evaluated in person or the need to arrange testing (such as labs, EKG, etc.), we will make arrangements to do so.     Although advances in technology are sophisticated, we cannot ensure that it will always work on either your end or our end.  If the connection with a video visit is poor, the visit may have to be switched to a telephone visit.  With either a video or telephone visit, we are not always able to ensure that we have a secure connection.     I need to obtain your verbal consent now.   Are you willing to proceed with your visit today?    Vanessa Hawkins has provided verbal consent on 09/29/2020 for a virtual visit (video or telephone).   Leeanne Rio, [place]   Date: 09/29/2020 2:07 PM   Virtual Visit via Video Note   I, Leeanne Rio, connected with  Vanessa Hawkins  ([id number], [birth year]) on 09/29/20 at  2:00 PM EDT by a video-enabled telemedicine application and verified that I am speaking with the correct person using two identifiers.  Location: Patient: Virtual Visit Location Patient: Home Provider: Virtual Visit Location Provider: Home Office   I discussed the limitations of evaluation and  management by telemedicine and the availability of in person appointments. The patient expressed understanding and agreed to proceed.    History of Present Illness: Vanessa Hawkins is a 55 y.o. who identifies as a female who was assigned female at birth, and is being seen today for recurrent itchy, red and raised area just abover the left upper lip which has been present for past couple of weeks. Initially thought was a mild allergy as she can sometimes get itchiness of lips after eating a few tomatoes. Notes this usually calms right down. Next she thought maybe cold sore/fever blister so applied topical Abreva to the area. States it seemed to keep area moisturized but did not heal the area. Notes no history of cold sore and denies any blistering or scabbing in the area. No pain. Denies any drainage or crusting from the area. Will note the area looks sometimes more xerotic than at other times. Denies similar area elsewhere.  HPI: HPI  Problems:  Patient Active Problem List   Diagnosis Date Noted   OAB (overactive bladder) 12/26/2019   Gastroesophageal reflux disease 02/16/2016   Anxiety state, unspecified 06/17/2013   Obesity 09/29/2008   BACK PAIN 09/29/2008   DEPRESSION 01/19/2007   Essential hypertension 01/19/2007   Allergic rhinitis 01/19/2007   ASTHMA 01/19/2007   ENLARGEMENT OF LYMPH NODES 01/19/2007   CHICKENPOX, HX OF 01/19/2007  Allergies:  Allergies  Allergen Reactions   Oxycodone-Acetaminophen Shortness Of Breath and Itching    Shortness of breath.  Can take tylenol   Medications:  Current Outpatient Medications:    triamcinolone cream (KENALOG) 0.1 %, Apply 1 application topically daily. For up to 2 weeks., Disp: 30 g, Rfl: 0   albuterol (VENTOLIN HFA) 108 (90 Base) MCG/ACT inhaler, INHALE TWO PUFFS EVERY 4 HOURS AS NEEDED, Disp: 18 g, Rfl: 11   ALPRAZolam (XANAX) 0.5 MG tablet, Take 1 tablet by mouth twice daily, Disp: 60 tablet, Rfl: 5    benazepril-hydrochlorthiazide (LOTENSIN HCT) 10-12.5 MG tablet, Take 1 tablet by mouth daily., Disp: 90 tablet, Rfl: 3   buPROPion (WELLBUTRIN XL) 150 MG 24 hr tablet, TAKE 1 TABLET BY MOUTH ONCE DAILY . APPOINTMENT REQUIRED FOR FUTURE REFILLS, Disp: 90 tablet, Rfl: 3   fluticasone (FLONASE) 50 MCG/ACT nasal spray, USE TWO SPRAYS IN EACH NOSTRIL ONCE DAILY, Disp: 48 g, Rfl: 1   montelukast (SINGULAIR) 10 MG tablet, Take 1 tablet (10 mg total) by mouth at bedtime., Disp: 90 tablet, Rfl: 3   oxybutynin (DITROPAN-XL) 10 MG 24 hr tablet, Take 1 tablet (10 mg total) by mouth at bedtime., Disp: 90 tablet, Rfl: 3  Observations/Objective: Patient is well-developed, well-nourished in no acute distress.  Resting comfortably at home.  Head is normocephalic, atraumatic.  No labored breathing. Speech is clear and coherent with logical content.  Patient is alert and oriented at baseline.  Mild erythema of left upper lip extending across the vermilion border without lesion, ulceration or evidence of significant excoriation. Is about 1 cm per estimation via video.  Assessment and Plan: 1. Rash - triamcinolone cream (KENALOG) 0.1 %; Apply 1 application topically daily. For up to 2 weeks.  Dispense: 30 g; Refill: 0 Looks like mild dermatitis 2/2 allergen. Does cross vermilion border so not perioral dermatitis. No vesicular lesions and waxing/waning pattern of symptoms so HSV unlikely. Supportive measures and OTC creams reviewed. Rx low potency kenalog cream for once daily use. Strict follow-up precautions reviewed.   Follow Up Instructions: I discussed the assessment and treatment plan with the patient. The patient was provided an opportunity to ask questions and all were answered. The patient agreed with the plan and demonstrated an understanding of the instructions.  A copy of instructions were sent to the patient via MyChart.  The patient was advised to call back or seek an in-person evaluation if the  symptoms worsen or if the condition fails to improve as anticipated.  Time:  I spent 14 minutes with the patient via telehealth technology discussing the above problems/concerns.    Leeanne Rio, PA-C

## 2020-09-29 NOTE — Patient Instructions (Signed)
  Vanessa Hawkins, thank you for joining Leeanne Rio, PA-C for today's virtual visit.  While this provider is not your primary care provider (PCP), if your PCP is located in our provider database this encounter information will be shared with them immediately following your visit.  Consent: (Patient) Vanessa Hawkins provided verbal consent for this virtual visit at the beginning of the encounter.  Current Medications:  Current Outpatient Medications:    albuterol (VENTOLIN HFA) 108 (90 Base) MCG/ACT inhaler, INHALE TWO PUFFS EVERY 4 HOURS AS NEEDED, Disp: 18 g, Rfl: 11   ALPRAZolam (XANAX) 0.5 MG tablet, Take 1 tablet by mouth twice daily, Disp: 60 tablet, Rfl: 5   benazepril-hydrochlorthiazide (LOTENSIN HCT) 10-12.5 MG tablet, Take 1 tablet by mouth daily., Disp: 90 tablet, Rfl: 3   buPROPion (WELLBUTRIN XL) 150 MG 24 hr tablet, TAKE 1 TABLET BY MOUTH ONCE DAILY . APPOINTMENT REQUIRED FOR FUTURE REFILLS, Disp: 90 tablet, Rfl: 3   fluticasone (FLONASE) 50 MCG/ACT nasal spray, USE TWO SPRAYS IN EACH NOSTRIL ONCE DAILY, Disp: 48 g, Rfl: 1   montelukast (SINGULAIR) 10 MG tablet, Take 1 tablet (10 mg total) by mouth at bedtime., Disp: 90 tablet, Rfl: 3   oxybutynin (DITROPAN-XL) 10 MG 24 hr tablet, Take 1 tablet (10 mg total) by mouth at bedtime., Disp: 90 tablet, Rfl: 3   Medications ordered in this encounter:  No orders of the defined types were placed in this encounter.    *If you need refills on other medications prior to your next appointment, please contact your pharmacy*  Follow-Up: Call back or seek an in-person evaluation if the symptoms worsen or if the condition fails to improve as anticipated.  Other Instructions Keep skin clean and dry. Ok to apply a non-perfumed moisturizing lotion to the area. Apply the steroid cream as directed -- only need a very thin layer.  Can use an over-the-counter antihistamine like Claritin to help with itch and inflammation. Keep a  symptom log so that you can see what possible triggers there are for symptoms in the future. If this is a recurring issue, you need follow-up with your primary care provider.   If you have been instructed to have an in-person evaluation today at a local Urgent Care facility, please use the link below. It will take you to a list of all of our available [facility], including address, phone number and hours of operation. Please do not delay care.  [facility]  If you or a family member do not have a primary care provider, use the link below to schedule a visit and establish care. When you choose a [facility] primary care physician or advanced practice provider, you gain a long-term partner in health. Find a Primary Care Provider  Learn more about [facility] in-office and virtual care options: [facility]

## 2020-11-08 ENCOUNTER — Other Ambulatory Visit: Payer: Self-pay | Admitting: Family Medicine

## 2020-11-09 NOTE — Telephone Encounter (Signed)
Pt advised to schedule appointment for her refill for Xanax, scheduled for a CPE ON 12/03/2020. Dr Sarajane Jews will review medication at the appointment

## 2020-12-03 ENCOUNTER — Encounter: Payer: 59 | Admitting: Family Medicine

## 2020-12-16 ENCOUNTER — Other Ambulatory Visit: Payer: Self-pay

## 2020-12-16 ENCOUNTER — Ambulatory Visit (INDEPENDENT_AMBULATORY_CARE_PROVIDER_SITE_OTHER): Payer: 59 | Admitting: Family Medicine

## 2020-12-16 ENCOUNTER — Encounter: Payer: Self-pay | Admitting: Family Medicine

## 2020-12-16 VITALS — BP 112/78 | HR 76 | Temp 98.4°F | Ht 69.5 in | Wt 225.0 lb

## 2020-12-16 DIAGNOSIS — Z23 Encounter for immunization: Secondary | ICD-10-CM | POA: Diagnosis not present

## 2020-12-16 DIAGNOSIS — Z0000 Encounter for general adult medical examination without abnormal findings: Secondary | ICD-10-CM

## 2020-12-16 LAB — URINALYSIS
Bilirubin Urine: NEGATIVE
Hgb urine dipstick: NEGATIVE
Ketones, ur: NEGATIVE
Leukocytes,Ua: NEGATIVE
Nitrite: NEGATIVE
Specific Gravity, Urine: 1.01 (ref 1.000–1.030)
Total Protein, Urine: NEGATIVE
Urine Glucose: NEGATIVE
Urobilinogen, UA: 0.2 (ref 0.0–1.0)
pH: 6 (ref 5.0–8.0)

## 2020-12-16 LAB — HEPATIC FUNCTION PANEL
ALT: 13 U/L (ref 0–35)
AST: 17 U/L (ref 0–37)
Albumin: 4.2 g/dL (ref 3.5–5.2)
Alkaline Phosphatase: 88 U/L (ref 39–117)
Bilirubin, Direct: 0 mg/dL (ref 0.0–0.3)
Total Bilirubin: 0.4 mg/dL (ref 0.2–1.2)
Total Protein: 7 g/dL (ref 6.0–8.3)

## 2020-12-16 LAB — CBC WITH DIFFERENTIAL/PLATELET
Basophils Absolute: 0 10*3/uL (ref 0.0–0.1)
Basophils Relative: 1 % (ref 0.0–3.0)
Eosinophils Absolute: 0.1 10*3/uL (ref 0.0–0.7)
Eosinophils Relative: 2.8 % (ref 0.0–5.0)
HCT: 38.2 % (ref 36.0–46.0)
Hemoglobin: 12.9 g/dL (ref 12.0–15.0)
Lymphocytes Relative: 39.3 % (ref 12.0–46.0)
Lymphs Abs: 1.1 10*3/uL (ref 0.7–4.0)
MCHC: 33.6 g/dL (ref 30.0–36.0)
MCV: 93.3 fl (ref 78.0–100.0)
Monocytes Absolute: 0.2 10*3/uL (ref 0.1–1.0)
Monocytes Relative: 6.2 % (ref 3.0–12.0)
Neutro Abs: 1.5 10*3/uL (ref 1.4–7.7)
Neutrophils Relative %: 50.7 % (ref 43.0–77.0)
Platelets: 327 10*3/uL (ref 150.0–400.0)
RBC: 4.1 Mil/uL (ref 3.87–5.11)
RDW: 13.3 % (ref 11.5–15.5)
WBC: 2.9 10*3/uL — ABNORMAL LOW (ref 4.0–10.5)

## 2020-12-16 LAB — BASIC METABOLIC PANEL
BUN: 7 mg/dL (ref 6–23)
CO2: 29 mEq/L (ref 19–32)
Calcium: 9.6 mg/dL (ref 8.4–10.5)
Chloride: 101 mEq/L (ref 96–112)
Creatinine, Ser: 0.84 mg/dL (ref 0.40–1.20)
GFR: 78.51 mL/min (ref >60.00)
Glucose, Bld: 88 mg/dL (ref 70–99)
Potassium: 3.8 mEq/L (ref 3.5–5.1)
Sodium: 137 mEq/L (ref 135–145)

## 2020-12-16 LAB — LIPID PANEL
Cholesterol: 182 mg/dL (ref 0–200)
HDL: 65.2 mg/dL (ref >39.00)
LDL Cholesterol: 103 mg/dL — ABNORMAL HIGH (ref 0–99)
NonHDL: 117.21
Total CHOL/HDL Ratio: 3
Triglycerides: 71 mg/dL (ref 0.0–149.0)
VLDL: 14.2 mg/dL (ref 0.0–40.0)

## 2020-12-16 LAB — HEMOGLOBIN A1C: Hgb A1c MFr Bld: 5.6 % (ref 4.6–6.5)

## 2020-12-16 LAB — TSH: TSH: 1.5 u[IU]/mL (ref 0.35–5.50)

## 2020-12-16 MED ORDER — BUPROPION HCL ER (XL) 150 MG PO TB24: ORAL_TABLET | ORAL | 3 refills | Status: DC

## 2020-12-16 MED ORDER — SOLIFENACIN SUCCINATE 10 MG PO TABS: 10.0000 mg | ORAL_TABLET | Freq: Every day | ORAL | 3 refills | Status: DC

## 2020-12-16 MED ORDER — BENAZEPRIL-HYDROCHLOROTHIAZIDE 10-12.5 MG PO TABS: 1.0000 | ORAL_TABLET | Freq: Every day | ORAL | 3 refills | Status: DC

## 2020-12-16 MED ORDER — MONTELUKAST SODIUM 10 MG PO TABS: 10.0000 mg | ORAL_TABLET | Freq: Every day | ORAL | 3 refills | Status: DC

## 2020-12-16 MED ORDER — ALPRAZOLAM 0.5 MG PO TABS: 0.5000 mg | ORAL_TABLET | Freq: Two times a day (BID) | ORAL | 5 refills | Status: DC

## 2020-12-16 NOTE — Progress Notes (Signed)
   Subjective:    Patient ID: Vanessa Hawkins, female    DOB: Dec 27, 1965, 55 y.o.   MRN: [id number]  HPI Here for a well exam. She feels well in general, but she says the Oxybutynin is not working as well as it used to. She has been experiencing some increased urgency and frequency lately. No burning or fever.    Review of Systems  Constitutional: Negative.   HENT: Negative.    Eyes: Negative.   Respiratory: Negative.    Cardiovascular: Negative.   Gastrointestinal: Negative.   Genitourinary:  Positive for frequency and urgency. Negative for decreased urine volume, difficulty urinating, dyspareunia, dysuria, enuresis, flank pain, hematuria and pelvic pain.  Musculoskeletal: Negative.   Skin: Negative.   Neurological: Negative.  Negative for headaches.  Psychiatric/Behavioral: Negative.        Objective:   Physical Exam Constitutional:      General: She is not in acute distress.    Appearance: Normal appearance. She is well-developed.  HENT:     Head: Normocephalic and atraumatic.     Right Ear: External ear normal.     Left Ear: External ear normal.     Nose: Nose normal.     Mouth/Throat:     Pharynx: No oropharyngeal exudate.  Eyes:     General: No scleral icterus.    Conjunctiva/sclera: Conjunctivae normal.     Pupils: Pupils are equal, round, and reactive to light.  Neck:     Thyroid: No thyromegaly.     Vascular: No JVD.  Cardiovascular:     Rate and Rhythm: Normal rate and regular rhythm.     Heart sounds: Normal heart sounds. No murmur heard.   No friction rub. No gallop.  Pulmonary:     Effort: Pulmonary effort is normal. No respiratory distress.     Breath sounds: Normal breath sounds. No wheezing or rales.  Chest:     Chest wall: No tenderness.  Abdominal:     General: Bowel sounds are normal. There is no distension.     Palpations: Abdomen is soft. There is no mass.     Tenderness: There is no abdominal tenderness. There is no guarding or rebound.   Musculoskeletal:        General: No tenderness. Normal range of motion.     Cervical back: Normal range of motion and neck supple.  Lymphadenopathy:     Cervical: No cervical adenopathy.  Skin:    General: Skin is warm and dry.     Findings: No erythema or rash.  Neurological:     Mental Status: She is alert and oriented to person, place, and time.     Cranial Nerves: No cranial nerve deficit.     Motor: No abnormal muscle tone.     Coordination: Coordination normal.     Deep Tendon Reflexes: Reflexes are normal and symmetric. Reflexes normal.  Psychiatric:        Behavior: Behavior normal.        Thought Content: Thought content normal.        Judgment: Judgment normal.          Assessment & Plan:  Well exam. We discussed diet and exercise. Get fasting labs. For the OAB, she will stop Oxybutynin and try Vesicare 10 mg daily.  Alysia Penna, MD

## 2020-12-16 NOTE — Addendum Note (Signed)
Addended by: Isaiah Serge D on: 12/16/2020 09:34 AM   Modules accepted: Orders

## 2020-12-18 NOTE — Addendum Note (Signed)
Addended by: Wyvonne Lenz on: 12/18/2020 03:25 PM   Modules accepted: Orders

## 2021-01-21 ENCOUNTER — Other Ambulatory Visit: Payer: Self-pay | Admitting: Family Medicine

## 2021-01-25 ENCOUNTER — Other Ambulatory Visit: Payer: Self-pay

## 2021-02-03 ENCOUNTER — Other Ambulatory Visit: Payer: Self-pay | Admitting: Family Medicine

## 2021-03-23 IMAGING — CT CT CHEST W/O CM
2 of 3 series · 15 of 36 positions shown, 18 images · non-contrast
Comparison: No prior chest CT. CT the abdomen and pelvis
11/07/2018.

CLINICAL DATA: 52-year-old female with history of pulmonary
nodules. Follow-up study.

EXAM:
CT CHEST WITHOUT CONTRAST
TECHNIQUE: Multidetector CT imaging of the chest was performed following the
standard protocol without IV contrast.

[Series 2: thorax · axial · 0.72mm/px · z∈[-319,-61]mm · 12 of 153 slices shown, 15 images]
[im 12/153  mediastinal]
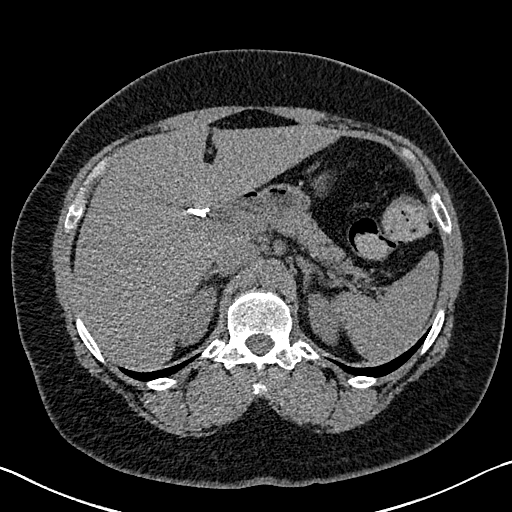
[im 12/153  lung]
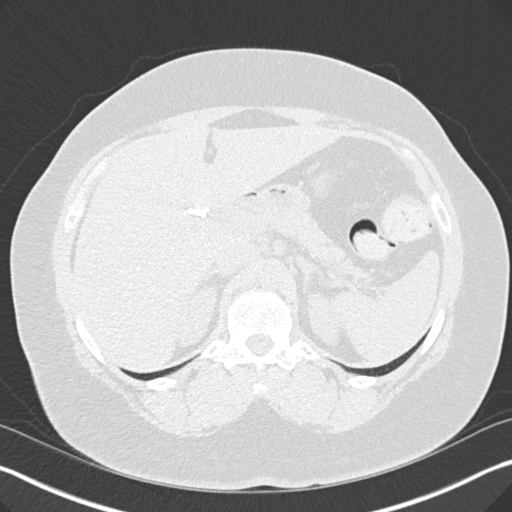
[im 23/153  lung]
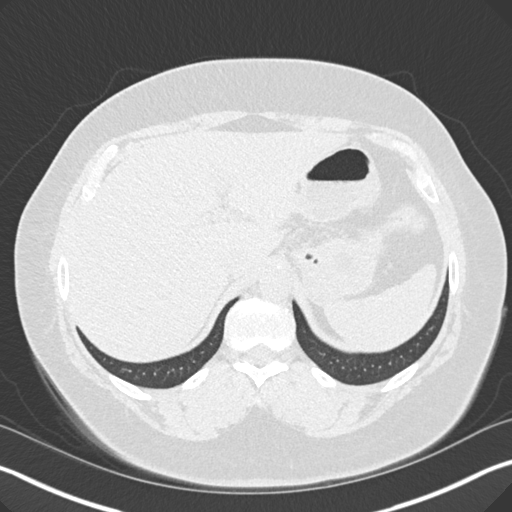
[im 34/153  lung]
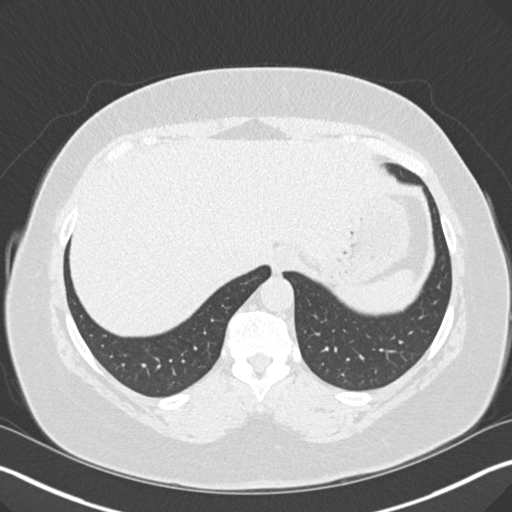
[im 46/153  lung]
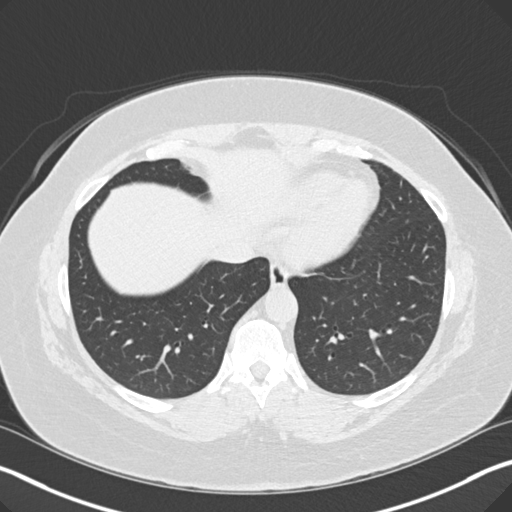
[im 57/153  mediastinal]
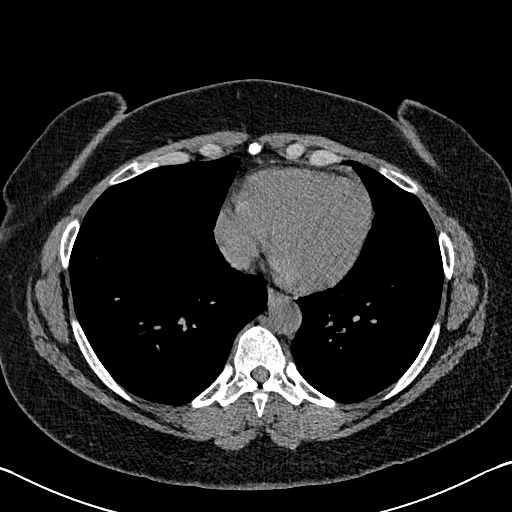
[im 57/153  lung]
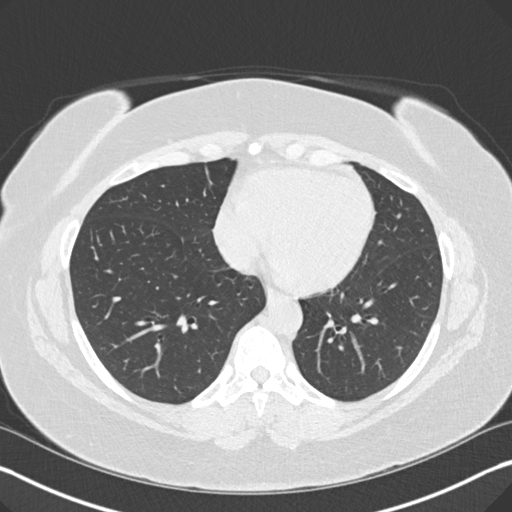
[im 68/153  lung]
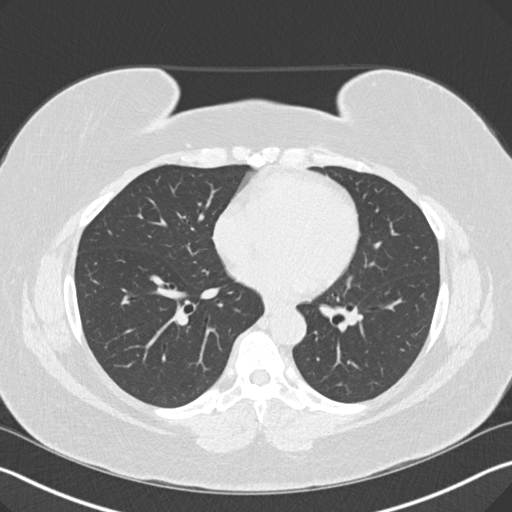
[im 85/153  lung]
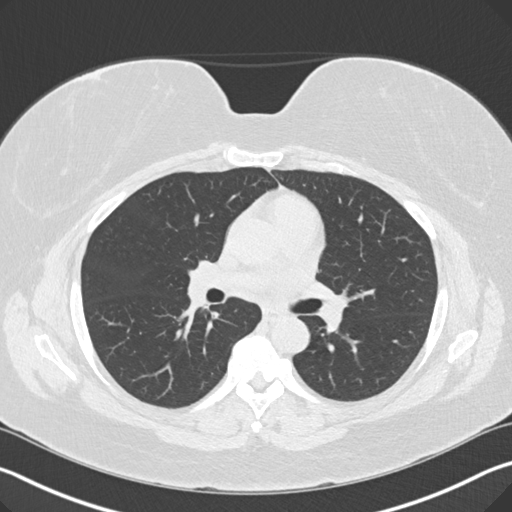
[im 96/153  lung]
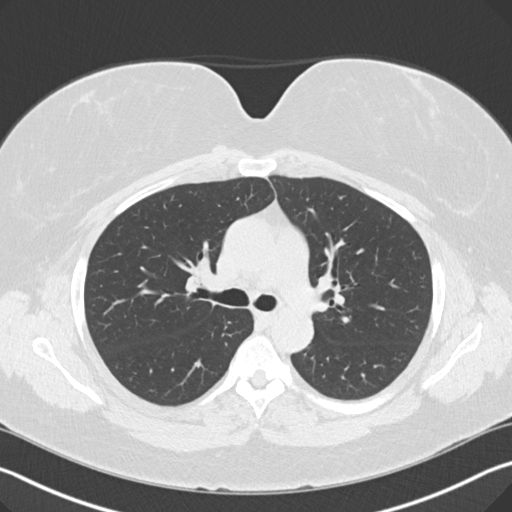
[im 107/153  mediastinal]
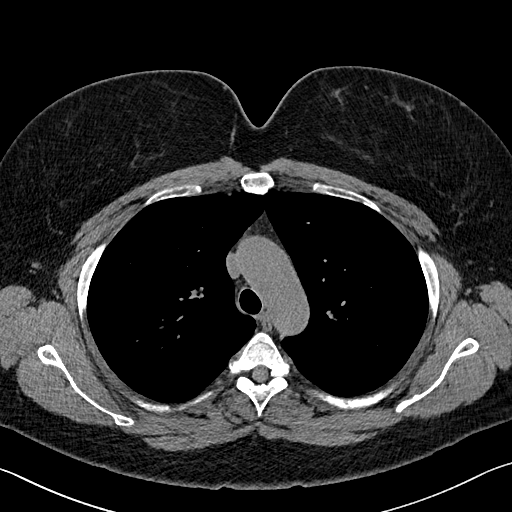
[im 107/153  lung]
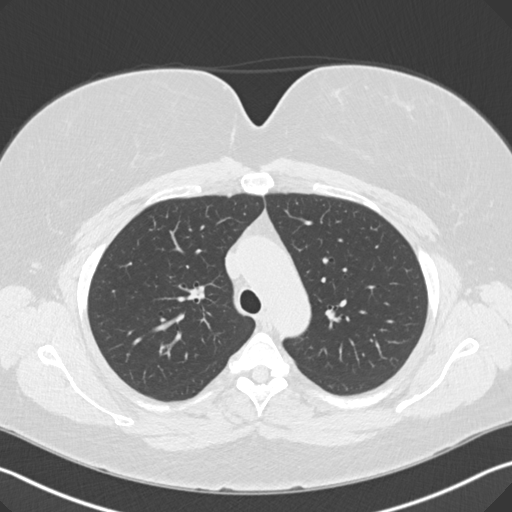
[im 119/153  lung]
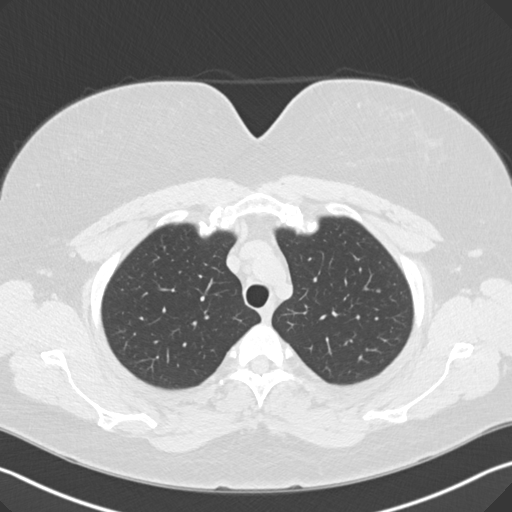
[im 130/153  lung]
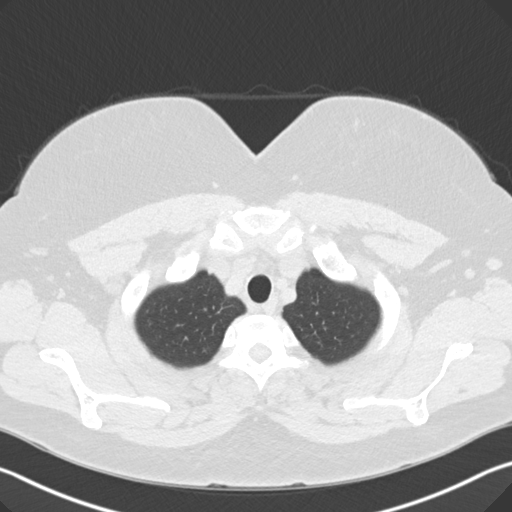
[im 141/153  lung]
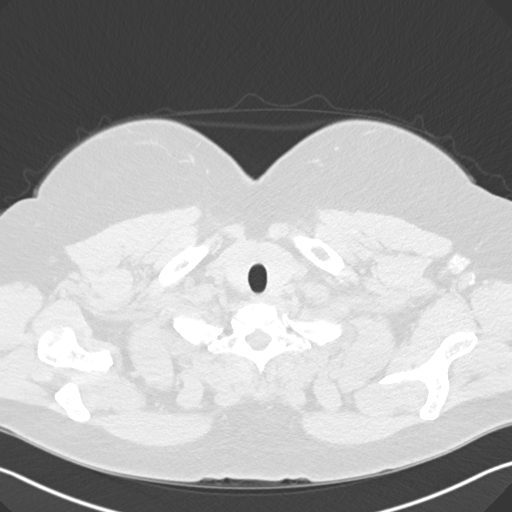

[Series 5: coronal · coronal · 0.60mm/px · 3 of 115 slices shown]
[im 23/115  lung]
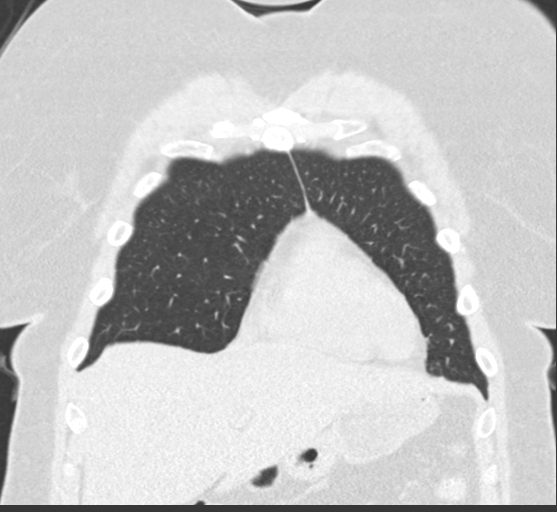
[im 46/115  lung]
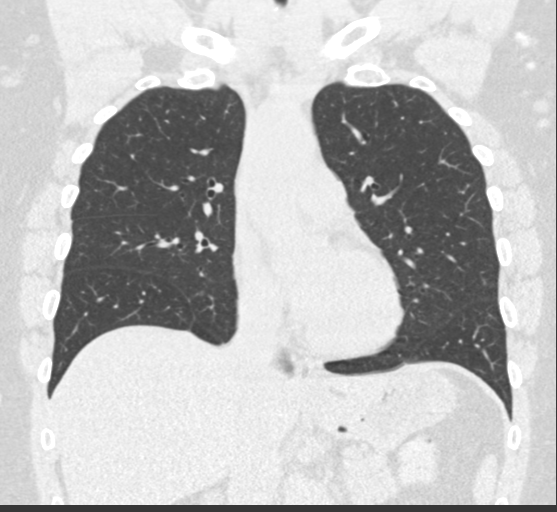
[im 69/115  lung]
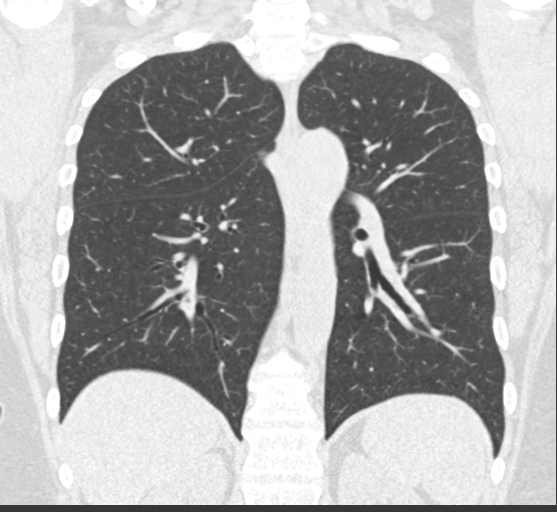

[15 of 36 positions shown; findings below may reference images not displayed]

FINDINGS: Cardiovascular: Heart size is normal. There is no significant
pericardial fluid, thickening or pericardial calcification. No
atherosclerotic calcifications in the thoracic aorta or the coronary
arteries.

Mediastinum/Nodes: No pathologically enlarged mediastinal or hilar
lymph nodes. Please note that accurate exclusion of hilar adenopathy
is limited on noncontrast CT scans. Esophagus is unremarkable in
appearance. No axillary lymphadenopathy.

Lungs/Pleura: 5 x 4 mm nodule in the right lower lobe, stable
compared to the recent CT the abdomen and pelvis (axial image 90 of
series 3). No larger more suspicious appearing pulmonary nodules or
masses are noted. No acute consolidative airspace disease. No
pleural effusions.

Upper Abdomen: Status post cholecystectomy.

Musculoskeletal: There are no aggressive appearing lytic or blastic
lesions noted in the visualized portions of the skeleton.
IMPRESSION: 1. 4 x 5 mm right lower lobe nodule, nonspecific, but statistically
likely benign. No follow-up needed if patient is low-risk.
Non-contrast chest CT can be considered in 12 months if patient is
high-risk. This recommendation follows the consensus statement:
Guidelines for Management of Incidental Pulmonary Nodules Detected

## 2021-07-08 ENCOUNTER — Other Ambulatory Visit: Payer: Self-pay | Admitting: Family Medicine

## 2021-07-09 NOTE — Telephone Encounter (Signed)
Last OV physical--12/16/20 ?Last refill--12/16/20--60 tabs, 5 refills ? ?No future OV scheduled.  ?

## 2021-08-30 ENCOUNTER — Encounter (HOSPITAL_COMMUNITY): Payer: Self-pay

## 2021-08-30 ENCOUNTER — Emergency Department (HOSPITAL_COMMUNITY): Payer: 59

## 2021-08-30 ENCOUNTER — Other Ambulatory Visit: Payer: Self-pay

## 2021-08-30 ENCOUNTER — Observation Stay (HOSPITAL_COMMUNITY)
Admission: EM | Admit: 2021-08-30 | Discharge: 2021-08-31 | Disposition: A | Discharge status: Home / Self Care | Payer: 59 | Attending: Internal Medicine | Admitting: Internal Medicine

## 2021-08-30 ENCOUNTER — Observation Stay (HOSPITAL_COMMUNITY): Payer: 59

## 2021-08-30 DIAGNOSIS — R200 Anesthesia of skin: Secondary | ICD-10-CM | POA: Diagnosis present

## 2021-08-30 DIAGNOSIS — G459 Transient cerebral ischemic attack, unspecified: Principal | ICD-10-CM | POA: Insufficient documentation

## 2021-08-30 DIAGNOSIS — E6609 Other obesity due to excess calories: Secondary | ICD-10-CM

## 2021-08-30 DIAGNOSIS — Z79899 Other long term (current) drug therapy: Secondary | ICD-10-CM | POA: Insufficient documentation

## 2021-08-30 DIAGNOSIS — I10 Essential (primary) hypertension: Secondary | ICD-10-CM | POA: Diagnosis present

## 2021-08-30 DIAGNOSIS — Z87891 Personal history of nicotine dependence: Secondary | ICD-10-CM | POA: Insufficient documentation

## 2021-08-30 DIAGNOSIS — R072 Precordial pain: Secondary | ICD-10-CM | POA: Insufficient documentation

## 2021-08-30 DIAGNOSIS — E669 Obesity, unspecified: Secondary | ICD-10-CM | POA: Diagnosis present

## 2021-08-30 DIAGNOSIS — E871 Hypo-osmolality and hyponatremia: Secondary | ICD-10-CM | POA: Diagnosis present

## 2021-08-30 DIAGNOSIS — M542 Cervicalgia: Secondary | ICD-10-CM | POA: Diagnosis not present

## 2021-08-30 DIAGNOSIS — E876 Hypokalemia: Secondary | ICD-10-CM | POA: Diagnosis present

## 2021-08-30 DIAGNOSIS — J45909 Unspecified asthma, uncomplicated: Secondary | ICD-10-CM | POA: Diagnosis not present

## 2021-08-30 DIAGNOSIS — R079 Chest pain, unspecified: Secondary | ICD-10-CM | POA: Diagnosis not present

## 2021-08-30 DIAGNOSIS — F419 Anxiety disorder, unspecified: Secondary | ICD-10-CM | POA: Diagnosis not present

## 2021-08-30 DIAGNOSIS — R112 Nausea with vomiting, unspecified: Secondary | ICD-10-CM | POA: Diagnosis not present

## 2021-08-30 DIAGNOSIS — Z6830 Body mass index (BMI) 30.0-30.9, adult: Secondary | ICD-10-CM

## 2021-08-30 DIAGNOSIS — Z981 Arthrodesis status: Secondary | ICD-10-CM

## 2021-08-30 DIAGNOSIS — F32A Depression, unspecified: Secondary | ICD-10-CM

## 2021-08-30 LAB — DIFFERENTIAL
Abs Immature Granulocytes: 0.02 10*3/uL (ref 0.00–0.07)
Basophils Absolute: 0 10*3/uL (ref 0.0–0.1)
Basophils Relative: 1 %
Eosinophils Absolute: 0.1 10*3/uL (ref 0.0–0.5)
Eosinophils Relative: 2 %
Immature Granulocytes: 1 %
Lymphocytes Relative: 28 %
Lymphs Abs: 1.2 10*3/uL (ref 0.7–4.0)
Monocytes Absolute: 0.2 10*3/uL (ref 0.1–1.0)
Monocytes Relative: 5 %
Neutro Abs: 2.7 10*3/uL (ref 1.7–7.7)
Neutrophils Relative %: 63 %

## 2021-08-30 LAB — URINALYSIS, ROUTINE W REFLEX MICROSCOPIC
Bilirubin Urine: NEGATIVE
Glucose, UA: NEGATIVE mg/dL
Hgb urine dipstick: NEGATIVE
Ketones, ur: NEGATIVE mg/dL
Nitrite: NEGATIVE
Protein, ur: NEGATIVE mg/dL
Specific Gravity, Urine: 1.008 (ref 1.005–1.030)
pH: 6 (ref 5.0–8.0)

## 2021-08-30 LAB — COMPREHENSIVE METABOLIC PANEL
ALT: 14 U/L (ref 0–44)
AST: 21 U/L (ref 15–41)
Albumin: 4.2 g/dL (ref 3.5–5.0)
Alkaline Phosphatase: 110 U/L (ref 38–126)
Anion gap: 10 (ref 5–15)
BUN: <5 mg/dL — ABNORMAL LOW (ref 6–20)
CO2: 25 mmol/L (ref 22–32)
Calcium: 9.1 mg/dL (ref 8.9–10.3)
Chloride: 97 mmol/L — ABNORMAL LOW (ref 98–111)
Creatinine, Ser: 0.79 mg/dL (ref 0.44–1.00)
GFR, Estimated: >60 mL/min (ref >60)
Glucose, Bld: 141 mg/dL — ABNORMAL HIGH (ref 70–99)
Potassium: 3.4 mmol/L — ABNORMAL LOW (ref 3.5–5.1)
Sodium: 132 mmol/L — ABNORMAL LOW (ref 135–145)
Total Bilirubin: 0.3 mg/dL (ref 0.3–1.2)
Total Protein: 7.7 g/dL (ref 6.5–8.1)

## 2021-08-30 LAB — RAPID URINE DRUG SCREEN, HOSP PERFORMED
Amphetamines: NOT DETECTED
Barbiturates: NOT DETECTED
Benzodiazepines: NOT DETECTED
Cocaine: NOT DETECTED
Opiates: NOT DETECTED
Tetrahydrocannabinol: NOT DETECTED

## 2021-08-30 LAB — I-STAT CHEM 8, ED
BUN: <3 mg/dL — ABNORMAL LOW (ref 6–20)
Calcium, Ion: 0.96 mmol/L — ABNORMAL LOW (ref 1.15–1.40)
Chloride: 99 mmol/L (ref 98–111)
Creatinine, Ser: 0.7 mg/dL (ref 0.44–1.00)
Glucose, Bld: 136 mg/dL — ABNORMAL HIGH (ref 70–99)
HCT: 41 % (ref 36.0–46.0)
Hemoglobin: 13.9 g/dL (ref 12.0–15.0)
Potassium: 2.8 mmol/L — ABNORMAL LOW (ref 3.5–5.1)
Sodium: 131 mmol/L — ABNORMAL LOW (ref 135–145)
TCO2: 18 mmol/L — ABNORMAL LOW (ref 22–32)

## 2021-08-30 LAB — LIPID PANEL
Cholesterol: 194 mg/dL (ref 0–200)
HDL: 71 mg/dL (ref >40)
LDL Cholesterol: 113 mg/dL — ABNORMAL HIGH (ref 0–99)
Total CHOL/HDL Ratio: 2.7 RATIO
Triglycerides: 48 mg/dL (ref <150)
VLDL: 10 mg/dL (ref 0–40)

## 2021-08-30 LAB — CBC
HCT: 39 % (ref 36.0–46.0)
Hemoglobin: 13.3 g/dL (ref 12.0–15.0)
MCH: 31.2 pg (ref 26.0–34.0)
MCHC: 34.1 g/dL (ref 30.0–36.0)
MCV: 91.5 fL (ref 80.0–100.0)
Platelets: 304 10*3/uL (ref 150–400)
RBC: 4.26 MIL/uL (ref 3.87–5.11)
RDW: 12 % (ref 11.5–15.5)
WBC: 4.1 10*3/uL (ref 4.0–10.5)
nRBC: 0 % (ref 0.0–0.2)

## 2021-08-30 LAB — HEMOGLOBIN A1C
Hgb A1c MFr Bld: 5.1 % (ref 4.8–5.6)
Mean Plasma Glucose: 99.67 mg/dL

## 2021-08-30 LAB — TROPONIN I (HIGH SENSITIVITY)
Troponin I (High Sensitivity): <2 ng/L (ref <18)
Troponin I (High Sensitivity): <2 ng/L (ref <18)

## 2021-08-30 LAB — APTT: aPTT: 27 seconds (ref 24–36)

## 2021-08-30 LAB — ETHANOL: Alcohol, Ethyl (B): <10 mg/dL (ref <10)

## 2021-08-30 LAB — TSH: TSH: 0.974 u[IU]/mL (ref 0.350–4.500)

## 2021-08-30 LAB — PROTIME-INR
INR: 1.1 (ref 0.8–1.2)
Prothrombin Time: 13.6 seconds (ref 11.4–15.2)

## 2021-08-30 LAB — D-DIMER, QUANTITATIVE: D-Dimer, Quant: 0.59 ug/mL-FEU — ABNORMAL HIGH (ref 0.00–0.50)

## 2021-08-30 LAB — CBG MONITORING, ED: Glucose-Capillary: 137 mg/dL — ABNORMAL HIGH (ref 70–99)

## 2021-08-30 MED ORDER — MONTELUKAST SODIUM 10 MG PO TABS
10.0000 mg | ORAL_TABLET | Freq: Every day | ORAL | Status: DC
  Administered 2021-08-30: 10 mg via ORAL
  Filled 2021-08-30: qty 1

## 2021-08-30 MED ORDER — ALPRAZOLAM 0.5 MG PO TABS: 0.5000 mg | ORAL_TABLET | Freq: Two times a day (BID) | ORAL | Status: DC | PRN

## 2021-08-30 MED ORDER — ACETAMINOPHEN 160 MG/5ML PO SOLN: 650.0000 mg | ORAL | Status: DC | PRN

## 2021-08-30 MED ORDER — ENOXAPARIN SODIUM 40 MG/0.4ML IJ SOSY
40.0000 mg | PREFILLED_SYRINGE | INTRAMUSCULAR | Status: DC
  Administered 2021-08-30: 40 mg via SUBCUTANEOUS
  Filled 2021-08-30: qty 0.4

## 2021-08-30 MED ORDER — ALPRAZOLAM 0.5 MG PO TABS: 0.5000 mg | ORAL_TABLET | Freq: Every day | ORAL | Status: DC | PRN

## 2021-08-30 MED ORDER — ASPIRIN 81 MG PO CHEW
81.0000 mg | CHEWABLE_TABLET | Freq: Every day | ORAL | Status: DC
  Administered 2021-08-31: 81 mg via ORAL
  Filled 2021-08-30: qty 1

## 2021-08-30 MED ORDER — POTASSIUM CHLORIDE CRYS ER 20 MEQ PO TBCR
40.0000 meq | EXTENDED_RELEASE_TABLET | Freq: Once | ORAL | Status: AC
  Administered 2021-08-30: 40 meq via ORAL
  Filled 2021-08-30: qty 2

## 2021-08-30 MED ORDER — ALPRAZOLAM 0.5 MG PO TABS: 0.5000 mg | ORAL_TABLET | Freq: Every evening | ORAL | Status: DC | PRN

## 2021-08-30 MED ORDER — IOHEXOL 350 MG/ML SOLN
100.0000 mL | Freq: Once | INTRAVENOUS | Status: AC | PRN
  Administered 2021-08-30: 50 mL via INTRAVENOUS

## 2021-08-30 MED ORDER — ACETAMINOPHEN 325 MG PO TABS: 650.0000 mg | ORAL_TABLET | ORAL | Status: DC | PRN

## 2021-08-30 MED ORDER — METHOCARBAMOL 500 MG PO TABS: 750.0000 mg | ORAL_TABLET | Freq: Every evening | ORAL | Status: DC | PRN

## 2021-08-30 MED ORDER — DARIFENACIN HYDROBROMIDE ER 7.5 MG PO TB24
15.0000 mg | ORAL_TABLET | Freq: Every day | ORAL | Status: DC
  Administered 2021-08-31: 15 mg via ORAL
  Filled 2021-08-30: qty 2

## 2021-08-30 MED ORDER — ACETAMINOPHEN 650 MG RE SUPP: 650.0000 mg | RECTAL | Status: DC | PRN

## 2021-08-30 MED ORDER — FLUTICASONE PROPIONATE 50 MCG/ACT NA SUSP
2.0000 | Freq: Every day | NASAL | Status: DC
  Administered 2021-08-31: 2 via NASAL
  Filled 2021-08-30: qty 16

## 2021-08-30 MED ORDER — STROKE: EARLY STAGES OF RECOVERY BOOK
Freq: Once | Status: AC
  Filled 2021-08-30: qty 1

## 2021-08-30 MED ORDER — BUPROPION HCL ER (XL) 150 MG PO TB24
150.0000 mg | ORAL_TABLET | Freq: Every day | ORAL | Status: DC
  Administered 2021-08-31: 150 mg via ORAL
  Filled 2021-08-30: qty 1

## 2021-08-30 MED ORDER — SODIUM CHLORIDE 0.9 % IV SOLN: INTRAVENOUS | Status: DC

## 2021-08-30 MED ORDER — LORAZEPAM 2 MG/ML IJ SOLN: 0.5000 mg | Freq: Once | INTRAMUSCULAR | Status: DC | PRN

## 2021-08-30 MED ORDER — SODIUM CHLORIDE 0.9 % IV BOLUS
1000.0000 mL | Freq: Once | INTRAVENOUS | Status: AC
  Administered 2021-08-30: 1000 mL via INTRAVENOUS

## 2021-08-30 NOTE — ED Notes (Signed)
Patient off floor at MRI at this time.

## 2021-08-30 NOTE — H&P (Addendum)
History and Physical    Patient: Vanessa Hawkins ACZ:[id number] DOB: 1966/01/19 DOA: 08/30/2021 DOS: the patient was seen and examined on 08/30/2021 PCP: Laurey Morale, MD  Patient coming from: Home via EMS  Chief Complaint:  Chief Complaint  Patient presents with   Numbness   Chest Pain   Shortness of Breath   HPI: Vanessa Hawkins is a 56 y.o. female with medical history significant of hypertension, asthma, anxiety, and depression who presents with complaints of left-sided numbness.  Over the last week patient reported that she had been having issues with stiff neck.  She had been using heat, muscle relaxer, and rest without significant improvement.  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 Kristeen Miss who warned about the possibility of adjacent rupture.  This morning woke up after 7 AM and reported that she did not feel well with "crick in her neck".  At that time she had the numbness tingling sensation down her left arm into her hand, some of her face, and her left foot.  She noted associated symptoms of some nausea and dizziness.  She was able to start driving to work, but along the drive started feeling short breath with complaints of chest tightness that she describes as feeling like elephant sitting on her chest.  She felt like she possibly could be having an anxiety attack, and decided to drive back home.  Patient took a Xanax prior to turning around to go back home, but by the time she got home she reported feeling as though she could not breathe and may pass out.  She called 911 at that time.  She did not know what was going on, but never had symptoms this severe.  In addition to this patient has several episodes of vomiting and reported having some palpitations intermittently days prior to this. Denies having any leg swelling, calf pain, prolonged sitting, abdominal pain, dysuria, fever, or recent sick contacts.  Patient does note family  history of blood clots in her dad and her sister.  She had been given full dose aspirin in route with EMS prior to arrival and called a code stroke.  Upon admission into the emergency department patient was seen as a code stroke.  Noted to be afebrile with tachypnea, and all other vital signs relatively maintained.  CT scan of the head did not note any acute abnormalities.  Patient was evaluated by telemetry neurology, but not noted to be a candidate for thrombolytics.  Labs noted sodium 132, potassium 3.4, glucose 141, and high-sensitivity troponins negative x2.  Urinalysis did not show significant signs of infection.  UDS negative and alcohol level undetectable.  Patient was given 1 L normal saline IV fluids 40 mEq of potassium chloride p.o.  Review of Systems: As mentioned in the history of present illness. All other systems reviewed and are negative. Past Medical History:  Diagnosis Date   Allergy    Asthma    Depression    Gynecological examination    sees [name]    Hypertension    Past Surgical History:  Procedure Laterality Date   ABDOMINAL HYSTERECTOMY     CARPAL TUNNEL RELEASE     CERVICAL [place] SURGERY     c4-5 dr elsner   CHOLECYSTECTOMY     COLONOSCOPY  09/13/2016   per Dr. Acquanetta Sit, adenomatous polyps, repeat in 5 years    ROTATOR CUFF REPAIR  01-18-12   left shoulder,  per Dr. French Ana    Social History:  reports that she quit smoking about 3 years ago. Her smoking use included e-cigarettes and cigarettes. She has a 20.00 pack-year smoking history. She has never used smokeless tobacco. She reports current alcohol use. She reports that she does not use drugs.  Allergies  Allergen Reactions   Oxycodone-Acetaminophen Shortness Of Breath and Itching    Shortness of breath.  Can take tylenol   Tyloxapol Shortness Of Breath    Family History  Problem Relation Age of Onset   Diabetes Other    Hyperlipidemia Other    Hypertension Other    Uterine cancer Other      Prior to Admission medications   Medication Sig Start Date End Date Taking? Authorizing Provider  albuterol (VENTOLIN HFA) 108 (90 Base) MCG/ACT inhaler INHALE 2 PUFFS BY MOUTH EVERY 4 HOURS AS NEEDED Patient taking differently: Inhale 1-2 puffs into the lungs every 4 (four) hours as needed for wheezing or shortness of breath. 01/25/21  Yes Laurey Morale, MD  ALPRAZolam Duanne Moron) 0.5 MG tablet Take 1 tablet by mouth twice daily Patient taking differently: Take 0.5 mg by mouth 2 (two) times daily as needed for anxiety. 07/09/21  Yes Laurey Morale, MD  ALPRAZolam Duanne Moron) 0.5 MG tablet Take 1 tablet by mouth daily as needed for sleep.   Yes [provider]  benazepril-hydrochlorthiazide (LOTENSIN HCT) 10-12.5 MG tablet Take 1 tablet by mouth daily. 12/16/20  Yes Laurey Morale, MD  buPROPion (WELLBUTRIN XL) 150 MG 24 hr tablet TAKE 1 TABLET BY MOUTH ONCE DAILY . APPOINTMENT REQUIRED FOR FUTURE REFILLS Patient taking differently: Take 150 mg by mouth daily. 12/16/20  Yes Laurey Morale, MD  fluticasone (FLONASE) 50 MCG/ACT nasal spray USE TWO SPRAYS IN EACH NOSTRIL ONCE DAILY Patient taking differently: Place 2 sprays into both nostrils daily. 12/26/16  Yes Laurey Morale, MD  methocarbamol (ROBAXIN) 750 MG tablet Take 750 mg by mouth at bedtime as needed for muscle spasms. 08/28/21  Yes [provider]  montelukast (SINGULAIR) 10 MG tablet Take 1 tablet (10 mg total) by mouth at bedtime. 12/16/20  Yes Laurey Morale, MD  solifenacin (VESICARE) 10 MG tablet Take 1 tablet (10 mg total) by mouth daily. 12/16/20  Yes Laurey Morale, MD    Physical Exam: Vitals:   08/30/21 1434 08/30/21 1445 08/30/21 1520 08/30/21 1600  BP: 116/75 134/84 (!) 121/101 (!) 127/112  Pulse: 79 80 79 80  Resp: '17 18 19 11  '$ Temp:      TempSrc:      SpO2: 99% 96% 100% 100%  Weight:      Height:       Constitutional: Middle-aged female currently in no acute distress Eyes: PERRL, lids and  conjunctivae normal ENMT: Mucous membranes are moist. Posterior pharynx clear of any exudate or lesions.  Neck: normal, supple, healed surgical scar on the anterior of the neck Respiratory: clear to auscultation bilaterally, no wheezing, no crackles.  Patient able to talk in complete sentences. Cardiovascular: Regular rate and rhythm, no murmurs / rubs / gallops. No extremity edema. 2+ pedal pulses.   Abdomen: no tenderness, no masses palpated. Bowel sounds positive.  Musculoskeletal: no clubbing / cyanosis. No joint deformity upper and lower extremities.  Skin: no rashes, lesions, ulcers. No induration Neurologic: CN 2-12 grossly intact. Sensation intact,  Strength 5/5 in all 4.  Psychiatric: Normal judgment and insight. Alert and oriented x 3. Normal mood.   Data Reviewed:  EKG reveals sinus rhythm at 82 bpm with borderline intraventricular conductiondelay Assessment and Plan: Left-sided numbness Acute.  Patient presented with complaints of left-sided numbness started this morning when she woke up.  She is not a candidate for thrombolytics given unclear onset.  CT scan of the brain did not note any acute abnormality.  Patient reports improvement in some of the symptoms -Admit to a telemetry bed -Stroke order set utilized -Neurochecks -Add on lipid panel and hemoglobin A1c -Check CT angiogram of the neck -Check MRI of the brain -Check echocardiogram -Continue aspirin -Dr. Hortense Ramal added to the treatment team and neurology consult placed  Chest pain Acute.  Patient reported having chest tightness which she reported as feeling like elephant was sitting on her chest.  Prior to arrival patient also had nausea and vomiting.  High-sensitivity troponins negative x2.  EKG without significant ischemic changes.  She denies any prolonged sitting or calf pain/swelling.  However, family history is significant for blood clots. -Check D-dimer -Follow-up echocardiogram -Follow-up telemetry overnight due  to reports of palpitations -May benefit from outpatient work-up for hypercoagulable state given family history  Hypokalemia Acute.  Initial potassium noted to be 3.4.  Patient is on a diuretic and had episodes of nausea and vomiting prior to arrival most likely cause.  Patient was given 40 mEq of potassium chloride po. -Continue to monitor and replace as needed  Hyponatremia Acute.  Sodium 132 on admission.  Patient on a diuretic in addition to nausea and vomiting prior to arrival as likely cause.  She had been given 1 L of normal saline IV fluids in the ED. -Resume home blood pressure regimen if work-up negative for stroke -Recheck BMP tomorrow  Neck stiffness history of cervical fusion Patient reported having issues with neck stiffness over the last week.  Prior history of ruptured cervical disc s/p repair by Dr. Ellene Route back in 2010. -Follow-up CT scan of the cervical spine  Nausea and vomiting Resolving.  Patient reported having several episodes of nausea and vomiting prior to arrival.  Denied any complaints of abdominal pain.  Urinalysis did not note significant signs of overt infection. -Diet as tolerated -Antiemetics as needed  Asthma, without exacerbation Patient without wheezes on physical exam at this time. -Butyryl nebs as needed for shortness of breath/  Essential hypertension Blood pressures currently maintained.  Home medication regimen includes benazepril-hydrochlorothiazide 10-25 mg daily. -Will need to resume home blood pressure regimen if work-up negative for a stroke  Anxiety and depression Patient reported feeling like she was possibly having a panic attack. Home medication regimen includes Wellbutrin 150 mg daily and Xanax as needed for anxiety/sleep. -Continue home regimen  Obesity BMI 30.74 kg/m  DVT prophylaxis: Lovenox Advance Care Planning:   Code Status: Full Code  Consults: Neurology  Family Communication: None requested  Severity of  Illness: The appropriate patient status for this patient is OBSERVATION. Observation status is judged to be reasonable and necessary in order to provide the required intensity of service to ensure the patient's safety. The patient's presenting symptoms, physical exam findings, and initial radiographic and laboratory data in the context of their medical condition is felt to place them at decreased risk for further clinical deterioration. Furthermore, it is anticipated that the patient will be medically stable for discharge from the hospital within 2 midnights of admission.   Author: Norval Morton, MD 08/30/2021 4:11 PM  For on call review [email or web address].

## 2021-08-30 NOTE — ED Triage Notes (Signed)
Patient to ED with complaints of chest pain with with radiation to left arm that started this morning around 0930. States that she woke up [facility] and she thought was related to Asthma and her inhaler not working. States that she felt  numbness and tingling in left arm this am when she woke around 0700. States that she went to bed yesterday around 2300. While in EMS truck after neb administration she developed left facial tingling. States that chest pain resolved on arrival to ED. Describes pain as pressure. Vomited in truck and was given Zofran. Hx of anxiety with Xanax taken this am.

## 2021-08-30 NOTE — ED Provider Notes (Signed)
[facility] EMERGENCY DEPARTMENT Provider Note   CSN: [id number] Arrival date & time: 08/30/21  1057  An emergency department physician performed an initial assessment on this suspected stroke patient at 1059.  History Chief Complaint  Patient presents with   Numbness   Chest Pain   Shortness of Breath    Vanessa Hawkins is a 56 y.o. female patient with hypertension, asthma, and allergies who presents to the emergency department as a code stroke per EMS.  Patient's last known well was around 7 to 7:30 AM this morning.  Patient woke up with a "crick in her neck."  Patient then began developing left arm numbness.  She was having some chest pain as well in addition to shortness of breath.  Since the onset of her left arm numbness she developed left facial numbness and left leg numbness.  EMS gave her 325 of aspirin prior to arrival and called code stroke.  Patient still having substernal chest pain.  HPI     Home Medications Prior to Admission medications   Medication Sig Start Date End Date Taking? Authorizing Provider  albuterol (VENTOLIN HFA) 108 (90 Base) MCG/ACT inhaler INHALE 2 PUFFS BY MOUTH EVERY 4 HOURS AS NEEDED Patient taking differently: Inhale 1-2 puffs into the lungs every 4 (four) hours as needed for wheezing or shortness of breath. 01/25/21  Yes Laurey Morale, MD  ALPRAZolam Duanne Moron) 0.5 MG tablet Take 1 tablet by mouth twice daily Patient taking differently: Take 0.5 mg by mouth 2 (two) times daily as needed for anxiety. 07/09/21  Yes Laurey Morale, MD  ALPRAZolam Duanne Moron) 0.5 MG tablet Take 1 tablet by mouth daily as needed for sleep.   Yes [provider]  benazepril-hydrochlorthiazide (LOTENSIN HCT) 10-12.5 MG tablet Take 1 tablet by mouth daily. 12/16/20  Yes Laurey Morale, MD  buPROPion (WELLBUTRIN XL) 150 MG 24 hr tablet TAKE 1 TABLET BY MOUTH ONCE DAILY . APPOINTMENT REQUIRED FOR FUTURE REFILLS Patient taking differently: Take 150 mg by mouth daily.  12/16/20  Yes Laurey Morale, MD  fluticasone (FLONASE) 50 MCG/ACT nasal spray USE TWO SPRAYS IN EACH NOSTRIL ONCE DAILY Patient taking differently: Place 2 sprays into both nostrils daily. 12/26/16  Yes Laurey Morale, MD  methocarbamol (ROBAXIN) 750 MG tablet Take 750 mg by mouth at bedtime as needed for muscle spasms. 08/28/21  Yes [provider]  montelukast (SINGULAIR) 10 MG tablet Take 1 tablet (10 mg total) by mouth at bedtime. 12/16/20  Yes Laurey Morale, MD  solifenacin (VESICARE) 10 MG tablet Take 1 tablet (10 mg total) by mouth daily. 12/16/20  Yes Laurey Morale, MD      Allergies    Oxycodone-acetaminophen and Tyloxapol    Review of Systems   Review of Systems  All other systems reviewed and are negative.   Physical Exam Updated Vital Signs BP (!) 127/112   Pulse 80   Temp 97.7 F (36.5 C) (Oral)   Resp 11   Ht 5' 9.5" (1.765 m)   Wt 95.8 kg   SpO2 100%   BMI 30.74 kg/m  Physical Exam Vitals and nursing note reviewed.  Constitutional:      General: She is not in acute distress.    Appearance: Normal appearance.  HENT:     Head: Normocephalic and atraumatic.  Eyes:     General:        Right eye: No discharge.        Left eye: No discharge.  Cardiovascular:     Comments: Regular rate and rhythm.  S1/S2 are distinct without any evidence of murmur, rubs, or gallops.  Radial pulses are 2+ bilaterally.  Dorsalis pedis pulses are 2+ bilaterally.  No evidence of pedal edema. Pulmonary:     Comments: Clear to auscultation bilaterally.  Normal effort.  No respiratory distress.  No evidence of wheezes, rales, or rhonchi heard throughout. Abdominal:     General: Abdomen is flat. Bowel sounds are normal. There is no distension.     Tenderness: There is no abdominal tenderness. There is no guarding or rebound.  Musculoskeletal:        General: Normal range of motion.     Cervical back: Neck supple.  Skin:    General: Skin is warm and dry.     Findings: No  rash.  Neurological:     General: No focal deficit present.     Mental Status: She is alert.     Comments: Cranial nerves II through XII are intact apart from some slight decrease subjective sensation in the left side of the face.  5/5 strength to the upper extremities.  Patient has slightly decreased dorsiflexion and plantarflexion of the left foot.  Decrease subjective sensation on the left leg.  No dysmetria on finger-nose.  No pronator drift.  Psychiatric:        Mood and Affect: Mood normal.        Behavior: Behavior normal.     ED Results / Procedures / Treatments   Labs (all labs ordered are listed, but only abnormal results are displayed) Labs Reviewed  COMPREHENSIVE METABOLIC PANEL - Abnormal; Notable for the following components:      Result Value   Sodium 132 (*)    Potassium 3.4 (*)    Chloride 97 (*)    Glucose, Bld 141 (*)    BUN <5 (*)    All other components within normal limits  URINALYSIS, ROUTINE W REFLEX MICROSCOPIC - Abnormal; Notable for the following components:   Leukocytes,Ua TRACE (*)    Bacteria, UA RARE (*)    All other components within normal limits  CBG MONITORING, ED - Abnormal; Notable for the following components:   Glucose-Capillary 137 (*)    All other components within normal limits  I-STAT CHEM 8, ED - Abnormal; Notable for the following components:   Sodium 131 (*)    Potassium 2.8 (*)    BUN <3 (*)    Glucose, Bld 136 (*)    Calcium, Ion 0.96 (*)    TCO2 18 (*)    All other components within normal limits  ETHANOL  PROTIME-INR  APTT  CBC  DIFFERENTIAL  RAPID URINE DRUG SCREEN, HOSP PERFORMED  TROPONIN I (HIGH SENSITIVITY)  TROPONIN I (HIGH SENSITIVITY)    EKG None  Radiology CT HEAD CODE STROKE WO CONTRAST  Result Date: 08/30/2021 CLINICAL DATA:  Code stroke.  Neuro deficit, acute, stroke suspected EXAM: CT HEAD WITHOUT CONTRAST TECHNIQUE: Contiguous axial images were obtained from the base of the skull through the vertex  without intravenous contrast. RADIATION DOSE REDUCTION: This exam was performed according to the departmental dose-optimization program which includes automated exposure control, adjustment of the mA and/or kV according to patient size and/or use of iterative reconstruction technique. COMPARISON:  None Available. FINDINGS: Brain: No evidence of acute large vascular territory infarction, hemorrhage, hydrocephalus, extra-axial collection or mass lesion/mass effect. Vascular: No hyperdense vessel identified. Skull: No acute fracture. Sinuses/Orbits: Clear visualized sinuses. No acute orbital findings.  Other: No mastoid effusions. ASPECTS [facility] Stroke Program Early CT Score) total score (0-10 with 10 being normal): 10. IMPRESSION: No evidence of acute intracranial abnormality. ASPECTS is 10. Code stroke imaging results were communicated on 08/30/2021 at 11:11 am to provider [facility] via telephone, who verbally acknowledged these results. Electronically Signed   By: Margaretha Sheffield M.D.   On: 08/30/2021 11:12    Procedures .Critical Care  Performed by: Hendricks Limes, PA-C Authorized by: Hendricks Limes, PA-C   Critical care provider statement:    Critical care time (minutes):  35   Critical care was necessary to treat or prevent imminent or life-threatening deterioration of the following conditions:  CNS failure or compromise   Critical care was time spent personally by me on the following activities:  Blood draw for specimens, development of treatment plan with patient or surrogate, discussions with consultants, discussions with primary provider, pulse oximetry, ordering and review of radiographic studies, ordering and review of laboratory studies and ordering and performing treatments and interventions     Medications Ordered in ED Medications  potassium chloride SA (KLOR-CON M) CR tablet 40 mEq (40 mEq Oral Given 08/30/21 1229)  sodium chloride 0.9 % bolus 1,000 mL (0 mLs Intravenous Stopped  08/30/21 1500)    ED Course/ Medical Decision Making/ A&P Clinical Course as of 08/30/21 1614  Mon Aug 30, 2021  1126 Spoke with Dr. Reeves Forth with teleneurology who does not feel that she is a candidate for tPA at this time.  He recommends consulting neurology here for further management of the patient. They recommend admitted for TIA workup. [CF]  1400 On reevaluation, patient sensation deficits have completely resolved.  She has equal strength in all 4 extremities.  I informed her on all the labs. [CF]  1540 CBC Normal [CF]  1540 I-stat chem 8, ED(!) Hyponatremia and hypokalemia.  Elevated glucose. [CF]  1540 Urinalysis, Routine w reflex microscopic Urine, Clean Catch(!) Without infection. [CF]  1540 Urine rapid drug screen (hosp performed) Negative. [CF]  1540 Comprehensive metabolic panel(!) Mild hyponatremia and hypokalemia.  This was repleted in the emergency department today. [CF]  1540 Protime-INR Normal. [CF]  1540 APTT Normal. [CF]  1540 Ethanol Negative. [CF]  1541 Troponin I (High Sensitivity) Initial delta troponin are normal. [CF]  1541 CT HEAD CODE STROKE WO CONTRAST I personally ordered and interpreted a CT head code stroke which was negative for any intracranial hemorrhage.  I do agree with radiology interpretation. [CF]  1614 I spoke with Dr. Tamala Julian with [facility] hospitalist who agrees to admit the patient. [CF]    Clinical Course User Index [CF] Hendricks Limes, PA-C                           Medical Decision Making SHAMARIE CALL is a 56 y.o. female patient who presents to the emergency department as an EMS code stroke.  I personally evaluated the patient at the bridge and patient does have subjective decrease numbness to the entire left side of the body and questionable decreased plantar and dorsiflexion of the left foot.  Concern that the patient is in the window we will proceed with a code stroke.  Patient going to CT scanner. Labs ordered.    Amount and/or  Complexity of Data Reviewed Labs: ordered. Decision-making details documented in ED Course. Radiology: ordered. Decision-making details documented in ED Course.  Risk Prescription drug management. Decision regarding hospitalization. Risk Details: Patient symptoms are  completely resolved.  Given that she came in as a code stroke and did have some subjective numbness to the left side of her body with a family history of TIA I do feel the patient would likely benefit from further work-up for TIA in the hospital.  Patient amenable this plan.  I discussed all labs and imaging with the patient at the bedside.  I will work on getting her admitted to the hospitalist service.    Final Clinical Impression(s) / ED Diagnoses Final diagnoses:  Left sided numbness  Chest pain, unspecified type    Rx / DC Orders ED Discharge Orders     None         Hendricks Limes, [place] 08/30/21 1615    Godfrey Pick, MD 09/01/21 [phone]

## 2021-08-30 NOTE — ED Notes (Signed)
Stroke RN Anisa on screen and states that Neurologist will not be assessing patient and will be in contact with EDP and they could order a stat Neuro consult if they choose. Code stroke cancelled at this time.

## 2021-08-30 NOTE — Plan of Care (Signed)

## 2021-08-30 NOTE — Consult Note (Signed)
Code stroke activated by pre elert at 1053. EDP assessed pt on arrival at 1057.  Left for CT at 1101 and returned at 1116.  Dr Reeves Forth called [facility] at [phone] after being paged stating he will not get on camera for code stroke but will update EDP by phone on recommendations for mild L side tingling with LKW 2300.  Off camera at 1127.

## 2021-08-31 ENCOUNTER — Observation Stay (HOSPITAL_BASED_OUTPATIENT_CLINIC_OR_DEPARTMENT_OTHER): Payer: 59

## 2021-08-31 ENCOUNTER — Other Ambulatory Visit (HOSPITAL_COMMUNITY): Payer: Self-pay | Admitting: *Deleted

## 2021-08-31 DIAGNOSIS — R200 Anesthesia of skin: Secondary | ICD-10-CM | POA: Diagnosis not present

## 2021-08-31 DIAGNOSIS — R079 Chest pain, unspecified: Secondary | ICD-10-CM | POA: Diagnosis not present

## 2021-08-31 DIAGNOSIS — F419 Anxiety disorder, unspecified: Secondary | ICD-10-CM | POA: Diagnosis not present

## 2021-08-31 DIAGNOSIS — E876 Hypokalemia: Secondary | ICD-10-CM | POA: Diagnosis not present

## 2021-08-31 LAB — ECHOCARDIOGRAM COMPLETE
Area-P 1/2: 5.27 cm2
Height: 69.5 in
S' Lateral: 3 cm
Weight: 3298.08 oz

## 2021-08-31 LAB — HIV ANTIBODY (ROUTINE TESTING W REFLEX): HIV Screen 4th Generation wRfx: NONREACTIVE

## 2021-08-31 MED ORDER — ATORVASTATIN CALCIUM 20 MG PO TABS
20.0000 mg | ORAL_TABLET | Freq: Every day | ORAL | Status: DC
  Administered 2021-08-31: 20 mg via ORAL
  Filled 2021-08-31: qty 1

## 2021-08-31 MED ORDER — CLOPIDOGREL BISULFATE 75 MG PO TABS
75.0000 mg | ORAL_TABLET | Freq: Every day | ORAL | Status: DC
  Administered 2021-08-31: 75 mg via ORAL
  Filled 2021-08-31: qty 1

## 2021-08-31 MED ORDER — ASPIRIN 81 MG PO CHEW: 81.0000 mg | CHEWABLE_TABLET | Freq: Every day | ORAL | Status: AC

## 2021-08-31 MED ORDER — ATORVASTATIN CALCIUM 20 MG PO TABS: 20.0000 mg | ORAL_TABLET | Freq: Every day | ORAL | 2 refills | Status: DC

## 2021-08-31 MED ORDER — CLOPIDOGREL BISULFATE 75 MG PO TABS: 75.0000 mg | ORAL_TABLET | Freq: Every day | ORAL | 0 refills | Status: DC

## 2021-08-31 NOTE — Progress Notes (Signed)
OT Cancellation Note  Patient Details Name: JIMMA ORTMAN MRN: [id number] DOB: November 06, 1965   Cancelled Treatment:    Reason Eval/Treat Not Completed: OT screened, no needs identified, will sign off. All symptoms resolved, demonstrates strength and coordination WFL, sensation intact. Pt performing ADLs and mobility independently. No further OT services required.    Guadelupe Sabin, OTR/L  [phone] 08/31/2021, 10:34 AM

## 2021-08-31 NOTE — Progress Notes (Signed)
Nsg Discharge Note  Admit Date:  08/30/2021 Discharge date: 08/31/2021   Seward Grater to be D/C'd Home per MD order.  AVS completed.  Copy for chart, and copy for patient signed, and dated. Patient/caregiver able to verbalize understanding.  Discharge Medication: Allergies as of 08/31/2021       Reactions   Oxycodone-acetaminophen Shortness Of Breath, Itching   Shortness of breath.  Can take tylenol   Tyloxapol Shortness Of Breath        Medication List     TAKE these medications    albuterol 108 (90 Base) MCG/ACT inhaler Commonly known as: VENTOLIN HFA INHALE 2 PUFFS BY MOUTH EVERY 4 HOURS AS NEEDED What changed:  how much to take how to take this when to take this reasons to take this additional instructions   ALPRAZolam 0.5 MG tablet Commonly known as: XANAX Take 1 tablet by mouth daily as needed for sleep. What changed: Another medication with the same name was changed. Make sure you understand how and when to take each.   ALPRAZolam 0.5 MG tablet Commonly known as: XANAX Take 1 tablet by mouth twice daily What changed:  when to take this reasons to take this   aspirin 81 MG chewable tablet Chew 1 tablet (81 mg total) by mouth daily.   atorvastatin 20 MG tablet Commonly known as: LIPITOR Take 1 tablet (20 mg total) by mouth daily.   benazepril-hydrochlorthiazide 10-12.5 MG tablet Commonly known as: LOTENSIN HCT Take 1 tablet by mouth daily.   buPROPion 150 MG 24 hr tablet Commonly known as: WELLBUTRIN XL TAKE 1 TABLET BY MOUTH ONCE DAILY . APPOINTMENT REQUIRED FOR FUTURE REFILLS What changed:  how much to take how to take this when to take this additional instructions   clopidogrel 75 MG tablet Commonly known as: PLAVIX Take 1 tablet (75 mg total) by mouth daily.   fluticasone 50 MCG/ACT nasal spray Commonly known as: FLONASE USE TWO SPRAYS IN EACH NOSTRIL ONCE DAILY What changed: See the new instructions.   methocarbamol 750 MG  tablet Commonly known as: ROBAXIN Take 750 mg by mouth at bedtime as needed for muscle spasms.   montelukast 10 MG tablet Commonly known as: SINGULAIR Take 1 tablet (10 mg total) by mouth at bedtime.   solifenacin 10 MG tablet Commonly known as: VESICARE Take 1 tablet (10 mg total) by mouth daily.        Discharge Assessment: Vitals:   08/31/21 0425 08/31/21 0805  BP: 127/77 (!) 150/99  Pulse: 77 76  Resp: 15 16  Temp: 98.1 F (36.7 C) 98 F (36.7 C)  SpO2: 100% 100%   Skin clean, dry and intact without evidence of skin break down, no evidence of skin tears noted. IV catheter discontinued intact. Site without signs and symptoms of complications - no redness or edema noted at insertion site, patient denies c/o pain - only slight tenderness at site.  Dressing with slight pressure applied.  D/c Instructions-Education: Discharge instructions given to patient/family with verbalized understanding. D/c education completed with patient/family including follow up instructions, medication list, d/c activities limitations if indicated, with other d/c instructions as indicated by MD - patient able to verbalize understanding, all questions fully answered. Patient instructed to return to ED, call 911, or call MD for any changes in condition.  Patient escorted via [place], and D/C home via private auto.  Loa Socks, RN 08/31/2021 4:04 PM

## 2021-08-31 NOTE — Evaluation (Signed)
Physical Therapy Evaluation Patient Details Name: Vanessa Hawkins MRN: [id number] DOB: [birth year] Today's Date: 08/31/2021  History of Present Illness  Vanessa Hawkins is a 56 y.o. female with medical history significant of hypertension, asthma, anxiety, and depression who presents with complaints of left-sided numbness.  Over the last week patient reported that she had been having issues with stiff neck.  She had been using heat, muscle relaxer, and rest without significant improvement.  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 Kristeen Miss who warned about the possibility of adjacent rupture.  This morning woke up after 7 AM and reported that she did not feel well with "crick in her neck".  At that time she had the numbness tingling sensation down her left arm into her hand, some of her face, and her left foot.  She noted associated symptoms of some nausea and dizziness.  She was able to start driving to work, but along the drive started feeling short breath with complaints of chest tightness that she describes as feeling like elephant sitting on her chest.  She felt like she possibly could be having an anxiety attack, and decided to drive back home.  Patient took a Xanax prior to turning around to go back home, but by the time she got home she reported feeling as though she could not breathe and may pass out.  She called 911 at that time.  She did not know what was going on, but never had symptoms this severe.  In addition to this patient has several episodes of vomiting and reported having some palpitations intermittently days prior to this. Denies having any leg swelling, calf pain, prolonged sitting, abdominal pain, dysuria, fever, or recent sick contacts.  Patient does note family history of blood clots in her dad and her sister.  She had been given full dose aspirin in route with EMS prior to arrival and called a code stroke.   Clinical Impression   Patient stating resolution of symptoms with only c/o neck tightness. She does not require assist with bed mobility. Bilateral LE strength and sensation WFL. Patient TTP of upper L cervical paraspinals and suboccipitals. She completes seated cervical retractions with cueing and good mechanics. States decrease in tension following. Patient educated on following up with outpatient PT if cervical symptoms persist and she wishes to pursue further treatment. Patient does not require additional PT services at this time.      Recommendations for follow up therapy are one component of a multi-disciplinary discharge planning process, led by the attending physician.  Recommendations may be updated based on patient status, additional functional criteria and insurance authorization.  Follow Up Recommendations No PT follow up      Assistance Recommended at Discharge None  Patient can return home with the following       Equipment Recommendations None recommended by PT  Recommendations for Other Services       Functional Status Assessment Patient has not had a recent decline in their functional status     Precautions / Restrictions Precautions Precautions: None Restrictions Weight Bearing Restrictions: No      Mobility  Bed Mobility Overal bed mobility: Independent                  Transfers                        Ambulation/Gait  Stairs            Wheelchair Mobility    Modified Rankin (Stroke Patients Only)       Balance Overall balance assessment: Independent                                           Pertinent Vitals/Pain Pain Assessment Pain Assessment: No/denies pain (neck tightness)    Home Living Family/patient expects to be discharged to:: Private residence Living Arrangements: Spouse/significant other Available Help at Discharge: Family Type of Home: House Home Access: Stairs to enter Entrance  Stairs-Rails: Right Entrance Stairs-Number of Steps: 3     Home Equipment: None      Prior Function Prior Level of Function : Independent/Modified Independent;Working/employed;Driving             Mobility Comments: community ambulation without AD ADLs Comments: independent     Hand Dominance        Extremity/Trunk Assessment   Upper Extremity Assessment Upper Extremity Assessment: Defer to OT evaluation    Lower Extremity Assessment Lower Extremity Assessment: Overall WFL for tasks assessed    Cervical / Trunk Assessment Cervical / Trunk Assessment: Normal  Communication   Communication: No difficulties  Cognition Arousal/Alertness: Awake/alert Behavior During Therapy: WFL for tasks assessed/performed Overall Cognitive Status: Within Functional Limits for tasks assessed                                          General Comments      Exercises Other Exercises Other Exercises: Cervical Retractions 2 x 10   Assessment/Plan    PT Assessment Patient does not need any further PT services  PT Problem List         PT Treatment Interventions      PT Goals (Current goals can be found in the Care Plan section)  Acute Rehab PT Goals Patient Stated Goal: return home PT Goal Formulation: With patient Time For Goal Achievement: 08/31/21 Potential to Achieve Goals: Good    Frequency       Co-evaluation PT/OT/SLP Co-Evaluation/Treatment: Yes Reason for Co-Treatment: Complexity of the patient's impairments (multi-system involvement) PT goals addressed during session: Mobility/safety with mobility;Balance;Strengthening/ROM         AM-PAC PT "6 Clicks" Mobility  Outcome Measure Help needed turning from your back to your side while in a flat bed without using bedrails?: None Help needed moving from lying on your back to sitting on the side of a flat bed without using bedrails?: None Help needed moving to and from a bed to a chair (including  a wheelchair)?: None Help needed standing up from a chair using your arms (e.g., wheelchair or bedside chair)?: None Help needed to walk in hospital room?: None Help needed climbing 3-5 steps with a railing? : None 6 Click Score: 24    End of Session   Activity Tolerance: Patient tolerated treatment well Patient left: in bed Nurse Communication: Mobility status PT Visit Diagnosis: Other abnormalities of gait and mobility (R26.89);Other symptoms and signs involving the nervous system (V76.160)    Time: 7371-0626 PT Time Calculation (min) (ACUTE ONLY): 7 min   Charges:   PT Evaluation $PT Eval Low Complexity: 1 Low         8:08 AM, 08/31/21 Mearl Latin PT,  DPT Physical Therapist at [facility]

## 2021-08-31 NOTE — TOC Progression Note (Signed)
  Transition of Care [facility]) Screening Note   Patient Details  Name: Vanessa Hawkins Date of Birth: 1965/07/03   [facility]) CM/SW Contact:    Shade Flood, LCSW Phone Number: 08/31/2021, 9:05 AM    Transition of Care Department [facility]) has reviewed patient and no TOC needs have been identified at this time. We will continue to monitor patient advancement through interdisciplinary progression rounds. If new patient transition needs arise, please place a TOC consult.

## 2021-08-31 NOTE — Progress Notes (Signed)
  Echocardiogram 2D Echocardiogram has been performed.  Vanessa Hawkins 08/31/2021, 1:26 PM

## 2021-08-31 NOTE — Hospital Course (Addendum)
56 year old female with a history of asthma, depression, hypertension, anxiety presenting with left-sided numbness.  She woke up on the morning of 08/30/2021 at 7 AM and reported that she did not feel well with "crick in her neck".  At that time she had the numbness tingling sensation down her left arm into her hand, some of her face, and her left foot.  She noted associated symptoms of some nausea and dizziness.  She was able to start driving to work, but along the drive started feeling short breath with complaints of chest tightness that she describes as feeling like elephant sitting on her chest.  She felt like she possibly could be having an anxiety attack, and decided to drive back home.  Patient took a Xanax prior to turning around to go back home, but by the time she got home she reported feeling as though she could not breathe and may pass out.  She called 911 at that time.  She states that she had a little bit of relief with the Xanax, but her symptoms were still quite severe.  She stated that the symptoms lasted 2 to 3 hours. Patient denies any fevers, chills, abdominal pain, diarrhea, hematochezia, melena.  She denied any prolonged sitting, leg pain, or leg swelling.   She had been given full dose aspirin in route with EMS prior to arrival and called a code stroke.   Upon admission into the emergency department patient was seen as a code stroke.  Noted to be afebrile with tachypnea, and all other vital signs relatively maintained.  CT scan of the head did not note any acute abnormalities.  Patient was evaluated by telemetry neurology, but not noted to be a candidate for thrombolytics.  Labs noted sodium 132, potassium 3.4, glucose 141, and high-sensitivity troponins negative x2.  Urinalysis did not show significant signs of infection.  UDS negative and alcohol level undetectable.  Patient was given 1 L normal saline IV fluids 40 mEq of potassium chloride p.o.  On the morning of 08/31/2021, the  patient's symptoms had completely resolved.  She denied any chest discomfort, shortness of breath, cough, hemoptysis, headache.  She stated that there was no further numbness or tingling in her arms or legs.  She denied any focal extremity weakness.  In addition, she stated there is no visual disturbance or dysarthria.  Vital signs remained stable without any tachycardia and she was saturating 100% room air.  She remained hemodynamically stable throughout the hospitalization.

## 2021-08-31 NOTE — Discharge Summary (Signed)
Physician Discharge Summary   Patient: Vanessa Hawkins MRN: [id number] DOB: 1965-09-06  Admit date:     08/30/2021  Discharge date: 08/31/21  Discharge Physician: Shanon Brow Miria Cappelli   PCP: Laurey Morale, MD   Recommendations at discharge:   Please follow up with primary care provider within 1-2 weeks  Please repeat BMP and CBC in one week    Hospital Course: 56 year old female with a history of asthma, depression, hypertension, anxiety presenting with left-sided numbness.  She woke up on the morning of 08/30/2021 at 7 AM and reported that she did not feel well with "crick in her neck".  At that time she had the numbness tingling sensation down her left arm into her hand, some of her face, and her left foot.  She noted associated symptoms of some nausea and dizziness.  She was able to start driving to work, but along the drive started feeling short breath with complaints of chest tightness that she describes as feeling like elephant sitting on her chest.  She felt like she possibly could be having an anxiety attack, and decided to drive back home.  Patient took a Xanax prior to turning around to go back home, but by the time she got home she reported feeling as though she could not breathe and may pass out.  She called 911 at that time.  She states that she had a little bit of relief with the Xanax, but her symptoms were still quite severe.  She stated that the symptoms lasted 2 to 3 hours. Patient denies any fevers, chills, abdominal pain, diarrhea, hematochezia, melena.  She denied any prolonged sitting, leg pain, or leg swelling.   She had been given full dose aspirin in route with EMS prior to arrival and called a code stroke.   Upon admission into the emergency department patient was seen as a code stroke.  Noted to be afebrile with tachypnea, and all other vital signs relatively maintained.  CT scan of the head did not note any acute abnormalities.  Patient was evaluated by telemetry neurology,  but not noted to be a candidate for thrombolytics.  Labs noted sodium 132, potassium 3.4, glucose 141, and high-sensitivity troponins negative x2.  Urinalysis did not show significant signs of infection.  UDS negative and alcohol level undetectable.  Patient was given 1 L normal saline IV fluids 40 mEq of potassium chloride p.o.  On the morning of 08/31/2021, the patient's symptoms had completely resolved.  She denied any chest discomfort, shortness of breath, cough, hemoptysis, headache.  She stated that there was no further numbness or tingling in her arms or legs.  She denied any focal extremity weakness.  In addition, she stated there is no visual disturbance or dysarthria.  Vital signs remained stable without any tachycardia and she was saturating 100% room air.  She remained hemodynamically stable throughout the hospitalization.  Assessment and Plan: TIA -PT/OT evaluation--no follow up -Speech therapy eval--not needed -CT brain--neg -MRI brain--neg -CTA neck--no vascular abnormalities -Echo--EF 60-65%, no WMA, no thrombi, no PFO -LDL--113 -HbA1C--5.1 -Antiplatelet--ASA 81 mg & plavix 75 mg x 21 days, then ASA 81 mg daily  Essential hypertension -Resume benazepril/HCTZ after discharge  Anxiety -PDMP reviewed -Patient received Xanax 0.5 mg, #60 on a monthly basis  Chest pain -Atypical by clinical history -troponins <2 followed by <2 -D-dimer 0.59>>>normal for patient's age -very low clinical suspicion for PE presently -Personally reviewed EKG--sinus rhythm -Echo--EF 60-65%, no WMA, no thrombi, no PFO  Dyslipidemia -LDL 112 -  Start Lipitor 20 mg daily  Hypokalemia -Repleted  Nausea and vomiting -Resolved -Tolerating diet presently  Neck stiffness history of cervical fusion Patient reported having issues with neck stiffness over the last week.  Prior history of ruptured cervical disc s/p repair by Dr. Ellene Route back in 2010. -CTA neck did not show any concerning features of  her cervical spine.  Obesity -BMI 30.00 -Lifestyle modification      Consultants: neurology Procedures performed: none  Disposition: Home Diet recommendation:  Cardiac diet DISCHARGE MEDICATION: Allergies as of 08/31/2021       Reactions   Oxycodone-acetaminophen Shortness Of Breath, Itching   Shortness of breath.  Can take tylenol   Tyloxapol Shortness Of Breath        Medication List     TAKE these medications    albuterol 108 (90 Base) MCG/ACT inhaler Commonly known as: VENTOLIN HFA INHALE 2 PUFFS BY MOUTH EVERY 4 HOURS AS NEEDED What changed:  how much to take how to take this when to take this reasons to take this additional instructions   ALPRAZolam 0.5 MG tablet Commonly known as: XANAX Take 1 tablet by mouth daily as needed for sleep. What changed: Another medication with the same name was changed. Make sure you understand how and when to take each.   ALPRAZolam 0.5 MG tablet Commonly known as: XANAX Take 1 tablet by mouth twice daily What changed:  when to take this reasons to take this   aspirin 81 MG chewable tablet Chew 1 tablet (81 mg total) by mouth daily.   atorvastatin 20 MG tablet Commonly known as: LIPITOR Take 1 tablet (20 mg total) by mouth daily.   benazepril-hydrochlorthiazide 10-12.5 MG tablet Commonly known as: LOTENSIN HCT Take 1 tablet by mouth daily.   buPROPion 150 MG 24 hr tablet Commonly known as: WELLBUTRIN XL TAKE 1 TABLET BY MOUTH ONCE DAILY . APPOINTMENT REQUIRED FOR FUTURE REFILLS What changed:  how much to take how to take this when to take this additional instructions   clopidogrel 75 MG tablet Commonly known as: PLAVIX Take 1 tablet (75 mg total) by mouth daily.   fluticasone 50 MCG/ACT nasal spray Commonly known as: FLONASE USE TWO SPRAYS IN EACH NOSTRIL ONCE DAILY What changed: See the new instructions.   methocarbamol 750 MG tablet Commonly known as: ROBAXIN Take 750 mg by mouth at bedtime as  needed for muscle spasms.   montelukast 10 MG tablet Commonly known as: SINGULAIR Take 1 tablet (10 mg total) by mouth at bedtime.   solifenacin 10 MG tablet Commonly known as: VESICARE Take 1 tablet (10 mg total) by mouth daily.        Discharge Exam: Filed Weights   08/30/21 1119 08/30/21 1921  Weight: 95.8 kg 93.5 kg   HEENT:  [place]/AT, No thrush, no icterus CV:  RRR, no rub, no S3, no S4 Lung:  CTA, no wheeze, no rhonchi Abd:  soft/+BS, NT Ext:  No edema, no lymphangitis, no synovitis, no rash Neuro:  CN II-XII intact, strength 4/5 in RUE, RLE, strength 4/5 LUE, LLE; sensation intact bilateral; no dysmetria; babinski equivocal   Condition at discharge: stable  The results of significant diagnostics from this hospitalization (including imaging, microbiology, ancillary and laboratory) are listed below for reference.   Imaging Studies: CT ANGIO NECK W OR WO CONTRAST  Result Date: 08/30/2021 CLINICAL DATA:  Stroke. Follow-up. Left-sided numbness beginning today. EXAM: CT ANGIOGRAPHY NECK TECHNIQUE: Multidetector CT imaging of the neck was performed using the standard  protocol during bolus administration of intravenous contrast. Multiplanar CT image reconstructions and MIPs were obtained to evaluate the vascular anatomy. Carotid stenosis measurements (when applicable) are obtained utilizing NASCET criteria, using the distal internal carotid diameter as the denominator. RADIATION DOSE REDUCTION: This exam was performed according to the departmental dose-optimization program which includes automated exposure control, adjustment of the mA and/or kV according to patient size and/or use of iterative reconstruction technique. CONTRAST:  50m OMNIPAQUE IOHEXOL 350 MG/ML SOLN COMPARISON:  MRI brain same day.  Head CT same day. FINDINGS: Aortic arch: Normal Right carotid system: Common carotid artery widely patent to the bifurcation. Carotid bifurcation is normal without soft or calcified plaque.  Cervical ICA is normal to the skull base. Left carotid system: Common carotid artery widely patent to the bifurcation. Carotid bifurcation is normal. Cervical ICA widely patent to the skull base. Vertebral arteries: Both vertebral artery origins are normal. The left vertebral artery is dominant. Both vertebral arteries are patent to and through the foramen magnum to the basilar artery. Skeleton: Distant ACDF C4-5. Other neck: No lymphadenopathy. Enlarged and heterogeneous thyroid gland. Upper chest: Normal IMPRESSION: No vascular abnormality in the neck. No evidence of atherosclerotic disease or dissection. Enlarged and heterogeneous thyroid gland. Recommend thyroid ultrasound (ref: J Am Coll Radiol. 2015 Feb;12(2): 143-50). Electronically Signed   By: MNelson ChimesM.D.   On: 08/30/2021 18:51   MR BRAIN WO CONTRAST  Result Date: 08/30/2021 CLINICAL DATA:  Stroke, follow-up. EXAM: MRI HEAD WITHOUT CONTRAST TECHNIQUE: Multiplanar, multiecho pulse sequences of the brain and surrounding structures were obtained without intravenous contrast. COMPARISON:  Head CT earlier same day FINDINGS: Brain: The brain has a normal appearance without evidence of malformation, atrophy, old or acute small or large vessel infarction, mass lesion, hemorrhage, hydrocephalus or extra-axial collection. Vascular: Major vessels at the base of the brain show flow. Venous sinuses appear patent. Skull and upper cervical spine: Normal. Sinuses/Orbits: Clear/normal. Other: None significant. IMPRESSION: Normal examination. Electronically Signed   By: MNelson ChimesM.D.   On: 08/30/2021 17:48   CT HEAD CODE STROKE WO CONTRAST  Result Date: 08/30/2021 CLINICAL DATA:  Code stroke.  Neuro deficit, acute, stroke suspected EXAM: CT HEAD WITHOUT CONTRAST TECHNIQUE: Contiguous axial images were obtained from the base of the skull through the vertex without intravenous contrast. RADIATION DOSE REDUCTION: This exam was performed according to the  departmental dose-optimization program which includes automated exposure control, adjustment of the mA and/or kV according to patient size and/or use of iterative reconstruction technique. COMPARISON:  None Available. FINDINGS: Brain: No evidence of acute large vascular territory infarction, hemorrhage, hydrocephalus, extra-axial collection or mass lesion/mass effect. Vascular: No hyperdense vessel identified. Skull: No acute fracture. Sinuses/Orbits: Clear visualized sinuses. No acute orbital findings. Other: No mastoid effusions. ASPECTS ([facility] Score) total score (0-10 with 10 being normal): 10. IMPRESSION: No evidence of acute intracranial abnormality. ASPECTS is 10. Code stroke imaging results were communicated on 08/30/2021 at 11:11 am to [facility], who verbally acknowledged these results. Electronically Signed   By: FMargaretha SheffieldM.D.   On: 08/30/2021 11:12    Microbiology: Results for orders placed or performed in visit on 10/03/18  Urine Culture     Status: None   Collection Time: 10/03/18 10:46 AM   Specimen: Urine  Result Value Ref Range Status   MICRO NUMBER: [id number] Final   SPECIMEN QUALITY: Adequate  Final   Sample Source URINE  Final   STATUS: FINAL  Final   ISOLATE 1:   Final    Multiple organisms present, each less than 10,000 CFU/mL. These organisms, commonly found on external and internal genitalia, are considered to be colonizers. No further testing performed.    Labs: CBC: Recent Labs  Lab 08/30/21 1112 08/30/21 1125  WBC  --  4.1  NEUTROABS  --  2.7  HGB 13.9 13.3  HCT 41.0 39.0  MCV  --  91.5  PLT  --  638   Basic Metabolic Panel: Recent Labs  Lab 08/30/21 1112 08/30/21 1125  NA 131* 132*  K 2.8* 3.4*  CL 99 97*  CO2  --  25  GLUCOSE 136* 141*  BUN <3* <5*  CREATININE 0.70 0.79  CALCIUM  --  9.1   Liver Function Tests: Recent Labs  Lab 08/30/21 1125  AST 21  ALT 14  ALKPHOS 110  BILITOT 0.3  PROT  7.7  ALBUMIN 4.2   CBG: Recent Labs  Lab 08/30/21 1100  GLUCAP 137*    Discharge time spent: greater than 30 minutes.  Signed: Orson Eva, MD [facility] 08/31/2021

## 2021-09-03 ENCOUNTER — Telehealth: Payer: Self-pay | Admitting: *Deleted

## 2021-09-03 NOTE — Telephone Encounter (Signed)
Transition Care Management Follow-up Telephone Call Date of discharge and from where: 08/31/21 FROM [facility] How have you been since you were released from the hospital? [name], [name] Any questions or concerns? No  Items Reviewed: Did the pt receive and understand the discharge instructions provided? Yes  Medications obtained and verified? Yes , WANTS TO DISCUSS LIPITOR WITH PCP PRIOR TO STARTING MEDICATION Other? No  Any new allergies since your discharge? No  Dietary orders reviewed? No Do you have support at home? Yes   Home Care and Equipment/Supplies: Were home health services ordered? no If so, what is the name of the agency? NA  Has the agency set up a time to come to the patient's home?NA Were any new equipment or medical supplies ordered?  No What is the name of the medical supply agency? NA Were you able to get the supplies/equipment? not applicable Do you have any questions related to the use of the equipment or supplies? No  Functional Questionnaire: (I = Independent and D = Dependent) ADLs: I  Bathing/Dressing- I  Meal Prep- I  Eating- I  Maintaining continence- I  Transferring/Ambulation- I  Managing Meds- I  Follow up appointments reviewed:  PCP Hospital f/u appt confirmed? Yes  Scheduled to see DR. FRY on 09/08/21 @ 1445. [facility] f/u appt confirmed? No  NOT NEEDED Are transportation arrangements needed? No  If their condition worsens, is the pt aware to call PCP or go to the Emergency Dept.? Yes Was the patient provided with contact information for the PCP's office or ED? Yes Was to pt encouraged to call back with questions or concerns? Yes   Hubert Azure RN, MSN RN Care Management Coordinator  [phone] [email or web address]

## 2021-09-08 ENCOUNTER — Encounter: Payer: Self-pay | Admitting: Family Medicine

## 2021-09-08 ENCOUNTER — Ambulatory Visit: Payer: 59 | Admitting: Family Medicine

## 2021-09-08 VITALS — BP 130/82 | HR 88 | Temp 98.9°F | Wt 200.0 lb

## 2021-09-08 DIAGNOSIS — E010 Iodine-deficiency related diffuse (endemic) goiter: Secondary | ICD-10-CM

## 2021-09-08 DIAGNOSIS — G459 Transient cerebral ischemic attack, unspecified: Secondary | ICD-10-CM

## 2021-09-08 NOTE — Progress Notes (Signed)
   Subjective:    Patient ID: Vanessa Hawkins, female    DOB: Jun 25, 1965, 56 y.o.   MRN: [id number]  HPI Here to follow up a hospital stay from 08-30-21 to 08-31-21 for the sudden onset of SOB, chest pain, and numbness or tingling in the left side of the face, the left arm, and the left leg. No weakness or slurred speech. At the ED she was given a head CT, then a brain MRI ,and then a CTA of the head and neck.these were all normal except for showing an enlarged thyroid. Her labs were unremarkable except for a slightly elevated d dimer at 0.59. Sodium was 132 and potassium was 3.4. An ECHO was normal with an EF of 60-65%. After she was given some IV fluids, the symptoms resolved completely. She admits to being very anxious that day , and she thinks this was all a "panic attack". She was diagnosed with a TIA so she was started on Lipitor as well as ASA 81 mg and Plavix 75 mg daily for 3 weeks. Then the Plavix will be stopped. She has felt fine since that day.    Review of Systems  Constitutional: Negative.   Respiratory:  Positive for chest tightness and shortness of breath.   Cardiovascular: Negative.   Neurological:  Positive for numbness. Negative for weakness and headaches.       Objective:   Physical Exam Constitutional:      Appearance: Normal appearance. She is not ill-appearing.  Cardiovascular:     Rate and Rhythm: Normal rate and regular rhythm.     Pulses: Normal pulses.     Heart sounds: Normal heart sounds.  Pulmonary:     Effort: Pulmonary effort is normal.     Breath sounds: Normal breath sounds.  Neurological:     General: No focal deficit present.     Mental Status: She is alert and oriented to person, place, and time. Mental status is at baseline.  Psychiatric:        Mood and Affect: Mood normal.        Behavior: Behavior normal.        Thought Content: Thought content normal.           Assessment & Plan:  She is recovering from what was either a TIA or a  panic attack. We will presume this was a TIA. I agree with her staying on ASA 81 mg daily for the rest of her life. She will follow up as needed. For the thyromegaly, we will set up a thyroid US soon. We spent a total of ( 34  ) minutes reviewing records and discussing these issues.  Alysia Penna, MD  Alysia Penna, MD

## 2021-09-10 ENCOUNTER — Ambulatory Visit
Admission: RE | Admit: 2021-09-10 | Discharge: 2021-09-10 | Disposition: A | Discharge status: Home / Self Care | Payer: 59 | Source: Ambulatory Visit | Attending: Family Medicine | Admitting: Family Medicine

## 2021-09-10 DIAGNOSIS — E010 Iodine-deficiency related diffuse (endemic) goiter: Secondary | ICD-10-CM

## 2021-12-11 ENCOUNTER — Other Ambulatory Visit: Payer: Self-pay | Admitting: Family Medicine

## 2021-12-24 ENCOUNTER — Other Ambulatory Visit: Payer: Self-pay | Admitting: Family Medicine

## 2022-01-06 ENCOUNTER — Other Ambulatory Visit: Payer: Self-pay | Admitting: Family Medicine

## 2022-01-08 ENCOUNTER — Other Ambulatory Visit: Payer: Self-pay | Admitting: Family Medicine

## 2022-01-10 NOTE — Telephone Encounter (Signed)
Pt LOV was 09/08/2021 Last refill was done on 07/09/21 Please advise

## 2022-01-21 ENCOUNTER — Other Ambulatory Visit: Payer: Self-pay | Admitting: Family Medicine

## 2022-01-31 ENCOUNTER — Encounter: Payer: Self-pay | Admitting: Family Medicine

## 2022-01-31 ENCOUNTER — Ambulatory Visit (INDEPENDENT_AMBULATORY_CARE_PROVIDER_SITE_OTHER): Payer: 59 | Admitting: Family Medicine

## 2022-01-31 VITALS — BP 110/78 | HR 76 | Temp 97.6°F | Ht 68.5 in | Wt 186.0 lb

## 2022-01-31 DIAGNOSIS — Z86010 Personal history of colonic polyps: Secondary | ICD-10-CM

## 2022-01-31 DIAGNOSIS — Z0000 Encounter for general adult medical examination without abnormal findings: Secondary | ICD-10-CM

## 2022-01-31 DIAGNOSIS — Z23 Encounter for immunization: Secondary | ICD-10-CM

## 2022-01-31 LAB — BASIC METABOLIC PANEL
BUN: 6 mg/dL (ref 6–23)
CO2: 30 mEq/L (ref 19–32)
Calcium: 9.8 mg/dL (ref 8.4–10.5)
Chloride: 97 mEq/L (ref 96–112)
Creatinine, Ser: 0.84 mg/dL (ref 0.40–1.20)
GFR: 77.89 mL/min (ref >60.00)
Glucose, Bld: 68 mg/dL — ABNORMAL LOW (ref 70–99)
Potassium: 4 mEq/L (ref 3.5–5.1)
Sodium: 132 mEq/L — ABNORMAL LOW (ref 135–145)

## 2022-01-31 LAB — HEMOGLOBIN A1C: Hgb A1c MFr Bld: 5.4 % (ref 4.6–6.5)

## 2022-01-31 LAB — LIPID PANEL
Cholesterol: 216 mg/dL — ABNORMAL HIGH (ref 0–200)
HDL: 82.7 mg/dL (ref >39.00)
LDL Cholesterol: 118 mg/dL — ABNORMAL HIGH (ref 0–99)
NonHDL: 133.66
Total CHOL/HDL Ratio: 3
Triglycerides: 79 mg/dL (ref 0.0–149.0)
VLDL: 15.8 mg/dL (ref 0.0–40.0)

## 2022-01-31 LAB — CBC WITH DIFFERENTIAL/PLATELET
Basophils Absolute: 0 10*3/uL (ref 0.0–0.1)
Basophils Relative: 1 % (ref 0.0–3.0)
Eosinophils Absolute: 0.1 10*3/uL (ref 0.0–0.7)
Eosinophils Relative: 3.3 % (ref 0.0–5.0)
HCT: 40.8 % (ref 36.0–46.0)
Hemoglobin: 13.8 g/dL (ref 12.0–15.0)
Lymphocytes Relative: 44.6 % (ref 12.0–46.0)
Lymphs Abs: 1.5 10*3/uL (ref 0.7–4.0)
MCHC: 33.7 g/dL (ref 30.0–36.0)
MCV: 93.9 fl (ref 78.0–100.0)
Monocytes Absolute: 0.2 10*3/uL (ref 0.1–1.0)
Monocytes Relative: 6 % (ref 3.0–12.0)
Neutro Abs: 1.5 10*3/uL (ref 1.4–7.7)
Neutrophils Relative %: 45.1 % (ref 43.0–77.0)
Platelets: 370 10*3/uL (ref 150.0–400.0)
RBC: 4.35 Mil/uL (ref 3.87–5.11)
RDW: 13.9 % (ref 11.5–15.5)
WBC: 3.3 10*3/uL — ABNORMAL LOW (ref 4.0–10.5)

## 2022-01-31 LAB — HEPATIC FUNCTION PANEL
ALT: 12 U/L (ref 0–35)
AST: 19 U/L (ref 0–37)
Albumin: 4.5 g/dL (ref 3.5–5.2)
Alkaline Phosphatase: 114 U/L (ref 39–117)
Bilirubin, Direct: 0.1 mg/dL (ref 0.0–0.3)
Total Bilirubin: 0.3 mg/dL (ref 0.2–1.2)
Total Protein: 7.4 g/dL (ref 6.0–8.3)

## 2022-01-31 LAB — TSH: TSH: 2.47 u[IU]/mL (ref 0.35–5.50)

## 2022-01-31 MED ORDER — FLUTICASONE PROPIONATE 50 MCG/ACT NA SUSP: 2.0000 | Freq: Every day | NASAL | 3 refills | Status: AC

## 2022-01-31 MED ORDER — MONTELUKAST SODIUM 10 MG PO TABS: 10.0000 mg | ORAL_TABLET | Freq: Every day | ORAL | 3 refills | Status: DC

## 2022-01-31 MED ORDER — BUPROPION HCL ER (XL) 150 MG PO TB24
ORAL_TABLET | ORAL | 3 refills | Status: DC
Start: 2022-01-31 — End: 2023-02-02

## 2022-01-31 MED ORDER — FLUTICASONE PROPIONATE 50 MCG/ACT NA SUSP: 2.0000 | Freq: Every day | NASAL | 3 refills | Status: DC

## 2022-01-31 MED ORDER — BENAZEPRIL-HYDROCHLOROTHIAZIDE 10-12.5 MG PO TABS
1.0000 | ORAL_TABLET | Freq: Every day | ORAL | 3 refills | Status: DC
Start: 2022-01-31 — End: 2023-02-02

## 2022-01-31 MED ORDER — SOLIFENACIN SUCCINATE 10 MG PO TABS: 10.0000 mg | ORAL_TABLET | Freq: Every day | ORAL | 3 refills | Status: DC

## 2022-01-31 NOTE — Addendum Note (Signed)
Addended by: Alysia Penna A on: 01/31/2022 10:29 AM   Modules accepted: Orders

## 2022-01-31 NOTE — Progress Notes (Signed)
   Subjective:    Patient ID: Vanessa Hawkins, female    DOB: 25-Jun-1965, 56 y.o.   MRN: [id number]  HPI Here for a well exam. She has been doing well in general, although her asthma has been acting up the past 3 months. She gets good relief from her albuterol inhaler, but she is using this 2-3 times a week.    Review of Systems  Constitutional: Negative.   HENT: Negative.    Eyes: Negative.   Respiratory: Negative.    Cardiovascular: Negative.   Gastrointestinal: Negative.   Genitourinary:  Negative for decreased urine volume, difficulty urinating, dyspareunia, dysuria, enuresis, flank pain, frequency, hematuria, pelvic pain and urgency.  Musculoskeletal: Negative.   Skin: Negative.   Neurological: Negative.  Negative for headaches.  Psychiatric/Behavioral: Negative.         Objective:   Physical Exam Constitutional:      General: She is not in acute distress.    Appearance: Normal appearance. She is well-developed.  HENT:     Head: Normocephalic and atraumatic.     Right Ear: External ear normal.     Left Ear: External ear normal.     Nose: Nose normal.     Mouth/Throat:     Pharynx: No oropharyngeal exudate.  Eyes:     General: No scleral icterus.    Conjunctiva/sclera: Conjunctivae normal.     Pupils: Pupils are equal, round, and reactive to light.  Neck:     Thyroid: No thyromegaly.     Vascular: No JVD.  Cardiovascular:     Rate and Rhythm: Normal rate and regular rhythm.     Heart sounds: Normal heart sounds. No murmur heard.    No friction rub. No gallop.  Pulmonary:     Effort: Pulmonary effort is normal. No respiratory distress.     Breath sounds: Normal breath sounds. No wheezing or rales.  Chest:     Chest wall: No tenderness.  Abdominal:     General: Bowel sounds are normal. There is no distension.     Palpations: Abdomen is soft. There is no mass.     Tenderness: There is no abdominal tenderness. There is no guarding or rebound.   Musculoskeletal:        General: No tenderness. Normal range of motion.     Cervical back: Normal range of motion and neck supple.  Lymphadenopathy:     Cervical: No cervical adenopathy.  Skin:    General: Skin is warm and dry.     Findings: No erythema or rash.  Neurological:     Mental Status: She is alert and oriented to person, place, and time.     Cranial Nerves: No cranial nerve deficit.     Motor: No abnormal muscle tone.     Coordination: Coordination normal.     Deep Tendon Reflexes: Reflexes are normal and symmetric. Reflexes normal.  Psychiatric:        Behavior: Behavior normal.        Thought Content: Thought content normal.        Judgment: Judgment normal.           Assessment & Plan:  Well exam. We discussed diet and exercise. Get fasting labs. For the asthma, I discussed with her the possibility of adding a maintenance inhaler, but she is not interested in that yet. I did suggest she add an antihistamine to her daily regimen. Alysia Penna, MD

## 2022-02-01 ENCOUNTER — Other Ambulatory Visit: Payer: Self-pay | Admitting: Family Medicine

## 2022-02-04 ENCOUNTER — Encounter: Payer: Self-pay | Admitting: Family Medicine

## 2022-02-07 MED ORDER — FLUTICASONE-SALMETEROL 115-21 MCG/ACT IN AERO: 2.0000 | INHALATION_SPRAY | Freq: Two times a day (BID) | RESPIRATORY_TRACT | 12 refills | Status: DC

## 2022-02-07 NOTE — Telephone Encounter (Signed)
I sent in some Advair she can try

## 2022-04-18 ENCOUNTER — Encounter: Payer: Self-pay | Admitting: Family Medicine

## 2022-04-19 ENCOUNTER — Telehealth: Payer: 59 | Admitting: Physician Assistant

## 2022-04-19 DIAGNOSIS — U071 COVID-19: Secondary | ICD-10-CM | POA: Diagnosis not present

## 2022-04-19 MED ORDER — NIRMATRELVIR/RITONAVIR (PAXLOVID)TABLET: 3.0000 | ORAL_TABLET | Freq: Two times a day (BID) | ORAL | 0 refills | Status: AC

## 2022-04-19 NOTE — Patient Instructions (Signed)
Vanessa Hawkins, thank you for joining Mar Daring, PA-C for today's virtual visit.  While this provider is not your primary care provider (PCP), if your PCP is located in our provider database this encounter information will be shared with them immediately following your visit.   [facility] account gives you access to today's visit and all your visits, tests, and labs performed at [facility] " click here if you don't have a [facility] account or go to [email or web address]  Consent: (Patient) Vanessa Hawkins provided verbal consent for this virtual visit at the beginning of the encounter.  Current Medications:  Current Outpatient Medications:    nirmatrelvir/ritonavir (PAXLOVID) 20 x 150 MG & 10 x 100MG TABS, Take 3 tablets by mouth 2 (two) times daily for 5 days. (Take nirmatrelvir 150 mg two tablets twice daily for 5 days and ritonavir 100 mg one tablet twice daily for 5 days) Patient GFR is 77, Disp: 30 tablet, Rfl: 0   albuterol (VENTOLIN HFA) 108 (90 Base) MCG/ACT inhaler, INHALE 2 PUFFS BY MOUTH EVERY 4 HOURS AS NEEDED, Disp: 9 g, Rfl: 0   ALPRAZolam (XANAX) 0.5 MG tablet, Take 1 tablet by mouth daily as needed for sleep., Disp: , Rfl:    aspirin 81 MG chewable tablet, Chew 1 tablet (81 mg total) by mouth daily., Disp: , Rfl:    benazepril-hydrochlorthiazide (LOTENSIN HCT) 10-12.5 MG tablet, Take 1 tablet by mouth daily., Disp: 90 tablet, Rfl: 3   buPROPion (WELLBUTRIN XL) 150 MG 24 hr tablet, Take 1 tablet by mouth once daily, Disp: 90 tablet, Rfl: 3   fluticasone (FLONASE) 50 MCG/ACT nasal spray, Place 2 sprays into both nostrils daily., Disp: 48 g, Rfl: 3   fluticasone-salmeterol (ADVAIR HFA) 115-21 MCG/ACT inhaler, Inhale 2 puffs into the lungs 2 (two) times daily., Disp: 1 each, Rfl: 12   montelukast (SINGULAIR) 10 MG tablet, Take 1 tablet (10 mg total) by mouth at bedtime., Disp: 90 tablet, Rfl: 3   solifenacin (VESICARE) 10 MG  tablet, Take 1 tablet (10 mg total) by mouth daily., Disp: 90 tablet, Rfl: 3   Medications ordered in this encounter:  Meds ordered this encounter  Medications   nirmatrelvir/ritonavir (PAXLOVID) 20 x 150 MG & 10 x 100MG TABS    Sig: Take 3 tablets by mouth 2 (two) times daily for 5 days. (Take nirmatrelvir 150 mg two tablets twice daily for 5 days and ritonavir 100 mg one tablet twice daily for 5 days) Patient GFR is 77    Dispense:  30 tablet    Refill:  0    Order Specific Question:   Supervising Provider    Answer:   Chase Picket [id number]     *If you need refills on other medications prior to your next appointment, please contact your pharmacy*  Follow-Up: Call back or seek an in-person evaluation if the symptoms worsen or if the condition fails to improve as anticipated.  [place] [phone]  Care Instructions:  Nirmatrelvir; Ritonavir Tablets What is this medication? NIRMATRELVIR; RITONAVIR (NIR ma TREL vir; ri TOE na veer) treats mild to moderate COVID-19. It may help people who are at high risk of developing severe illness. It works by limiting the spread of the virus in your body. This medicine may be used for other purposes; ask your health care provider or pharmacist if you have questions. COMMON BRAND NAME(S): PAXLOVID What should I tell my care team before I take this  medication? They need to know if you have any of these conditions: Any allergies Any serious illness Kidney disease Liver disease An unusual or allergic reaction to nirmatrelvir, ritonavir, other medications, foods, dyes, or preservatives Pregnant or trying to get pregnant Breast-feeding How should I use this medication? This product contains 2 different medications that are packaged together. For the standard dose, take 2 pink tablets of nirmatrelvir with 1 white tablet of ritonavir (3 tablets total) by mouth with water twice daily. Talk to your care team if you have kidney  disease. You may need a different dose. Swallow the tablets whole. You can take it with or without food. If it upsets your stomach, take it with food. Take all of this medication unless your care team tells you to stop it early. Keep taking it even if you think you are better. Talk to your care team about the use of this medication in children. While it may be prescribed for children as young as 12 years for selected conditions, precautions do apply. Overdosage: If you think you have taken too much of this medicine contact a poison control center or emergency room at once. NOTE: This medicine is only for you. Do not share this medicine with others. What if I miss a dose? If you miss a dose, take it as soon as you can unless it is more than 8 hours late. If it is more than 8 hours late, skip the missed dose. Take the next dose at the normal time. Do not take extra or 2 doses at the same time to make up for the missed dose. What may interact with this medication? Do not take this medication with any of the following medications: Alfuzosin Certain medications for anxiety or sleep like midazolam, triazolam Certain medications for cancer like apalutamide, enzalutamide Certain medications for cholesterol like lovastatin, simvastatin Certain medications for irregular heart beat like amiodarone, dronedarone, flecainide, propafenone, quinidine Certain medications for pain like meperidine, piroxicam Certain medications for psychotic disorders like clozapine, lurasidone, pimozide Certain medications for seizures like carbamazepine, phenobarbital, phenytoin Colchicine Eletriptan Eplerenone Ergot alkaloids like dihydroergotamine, ergonovine, ergotamine, methylergonovine Finerenone Flibanserin Ivabradine Lomitapide Naloxegol Ranolazine Rifampin Sildenafil Silodosin [facility]. John's Wort Tolvaptan Ubrogepant Voclosporin This medication may also interact with the following medications: Bedaquiline Birth  control pills Bosentan Certain antibiotics like erythromycin or clarithromycin Certain medications for blood pressure like amlodipine, diltiazem, felodipine, nicardipine, nifedipine Certain medications for cancer like abemaciclib, ceritinib, dasatinib, encorafenib, ibrutinib, ivosidenib, neratinib, nilotinib, venetoclax, vinblastine, vincristine Certain medications for cholesterol like atorvastatin, rosuvastatin Certain medications for depression like bupropion, trazodone Certain medications for fungal infections like isavuconazonium, itraconazole, ketoconazole, voriconazole Certain medications for hepatitis C like elbasvir; grazoprevir, dasabuvir; ombitasvir; paritaprevir; ritonavir, glecaprevir; pibrentasvir, sofosbuvir; velpatasvir; voxilaprevir Certain medications for HIV or AIDS Certain medications for irregular heartbeat like lidocaine Certain medications that treat or prevent blood clots like rivaroxaban, warfarin Digoxin Fentanyl Medications that lower your chance of fighting infection like cyclosporine, sirolimus, tacrolimus Methadone Quetiapine Rifabutin Salmeterol Steroid medications like betamethasone, budesonide, ciclesonide, dexamethasone, fluticasone, methylprednisolone, mometasone, triamcinolone This list may not describe all possible interactions. Give your health care provider a list of all the medicines, herbs, non-prescription drugs, or dietary supplements you use. Also tell them if you smoke, drink alcohol, or use illegal drugs. Some items may interact with your medicine. What should I watch for while using this medication? Your condition will be monitored carefully while you are receiving this medication. Visit your care team for regular checkups. Tell your care team if  your symptoms do not start to get better or if they get worse. If you have untreated HIV infection, this medication may lead to some HIV medications not working as well in the future. Estrogen and  progestin hormones may not work as well while you are taking this medication. Your care team can help you find the contraceptive option that works for you. What side effects may I notice from receiving this medication? Side effects that you should report to your care team as soon as possible: Allergic reactions--skin rash, itching, hives, swelling of the face, lips, tongue, or throat Liver injury--right upper belly pain, loss of appetite, nausea, light-colored stool, dark yellow or brown urine, yellowing skin or eyes, unusual weakness or fatigue Redness, blistering, peeling, or loosening of the skin, including inside the mouth Side effects that usually do not require medical attention (report these to your care team if they continue or are bothersome): Change in taste Diarrhea General discomfort and fatigue Increase in blood pressure Muscle pain Nausea Stomach pain This list may not describe all possible side effects. Call your doctor for medical advice about side effects. You may report side effects to FDA at 1-800-FDA-1088. Where should I keep my medication? Keep out of the reach of children and pets. Store at room temperature between 20 and 25 degrees C (68 and 77 degrees F). Get rid of any unused medication after the expiration date. To get rid of medications that are no longer needed or have expired: Take the medication to a medication take-back program. Check with your pharmacy or law enforcement to find a location. If you cannot return the medication, check the label or package insert to see if the medication should be thrown out in the garbage or flushed down the toilet. If you are not sure, ask your care team. If it is safe to put it in the trash, take the medication out of the container. Mix the medication with cat litter, dirt, coffee grounds, or other unwanted substance. Seal the mixture in a bag or container. Put it in the trash. NOTE: This sheet is a summary. It may not cover all  possible information. If you have questions about this medicine, talk to your doctor, pharmacist, or health care provider.  2023 Elsevier/Gold Standard (2020-02-24 00:00:00)    Isolation Instructions: You are to isolate at home for 5 days from onset of your symptoms. If you must be around other household members who do not have symptoms, you need to make sure that both you and the family members are masking consistently with a high-quality mask.  After day 5 of isolation, if you have had no fever within 24 hours and you are feeling better, you can end isolation but need to mask for an additional 5 days.  After day 5 if you have a fever or are having significant symptoms, please isolate for full 10 days.  If you note any worsening of symptoms despite treatment, please seek an in-person evaluation ASAP. If you note any significant shortness of breath or any chest pain, please seek ER evaluation. Please do not delay care!   COVID-19: What to Do if You Are Sick If you test positive and are an older adult or someone who is at high risk of getting very sick from COVID-19, treatment may be available. Contact a healthcare provider right away after a positive test to determine if you are eligible, even if your symptoms are mild right now. You can also visit a Test to Treat  location and, if eligible, receive a prescription from a provider. Don't delay: Treatment must be started within the first few days to be effective. If you have a fever, cough, or other symptoms, you might have COVID-19. Most people have mild illness and are able to recover at home. If you are sick: Keep track of your symptoms. If you have an emergency warning sign (including trouble breathing), call 911. Steps to help prevent the spread of COVID-19 if you are sick If you are sick with COVID-19 or think you might have COVID-19, follow the steps below to care for yourself and to help protect other people in your home and  community. Stay home except to get medical care Stay home. Most people with COVID-19 have mild illness and can recover at home without medical care. Do not leave your home, except to get medical care. Do not visit public areas and do not go to places where you are unable to wear a mask. Take care of yourself. Get rest and stay hydrated. Take over-the-counter medicines, such as acetaminophen, to help you feel better. Stay in touch with your doctor. Call before you get medical care. Be sure to get care if you have trouble breathing, or have any other emergency warning signs, or if you think it is an emergency. Avoid public transportation, ride-sharing, or taxis if possible. Get tested If you have symptoms of COVID-19, get tested. While waiting for test results, stay away from others, including staying apart from those living in your household. Get tested as soon as possible after your symptoms start. Treatments may be available for people with COVID-19 who are at risk for becoming very sick. Don't delay: Treatment must be started early to be effective--some treatments must begin within 5 days of your first symptoms. Contact your healthcare provider right away if your test result is positive to determine if you are eligible. Self-tests are one of several options for testing for the virus that causes COVID-19 and may be more convenient than laboratory-based tests and point-of-care tests. Ask your healthcare provider or your local health department if you need help interpreting your test results. You can visit your state, tribal, local, and territorial health department's website to look for the latest local information on testing sites. Separate yourself from other people As much as possible, stay in a specific room and away from other people and pets in your home. If possible, you should use a separate bathroom. If you need to be around other people or animals in or outside of the home, wear a well-fitting  mask. Tell your close contacts that they may have been exposed to COVID-19. An infected person can spread COVID-19 starting 48 hours (or 2 days) before the person has any symptoms or tests positive. By letting your close contacts know they may have been exposed to COVID-19, you are helping to protect everyone. See COVID-19 and Animals if you have questions about pets. If you are diagnosed with COVID-19, someone from the health department may call you. Answer the call to slow the spread. Monitor your symptoms Symptoms of COVID-19 include fever, cough, or other symptoms. Follow care instructions from your healthcare provider and local health department. Your local health authorities may give instructions on checking your symptoms and reporting information. When to seek emergency medical attention Look for emergency warning signs* for COVID-19. If someone is showing any of these signs, seek emergency medical care immediately: Trouble breathing Persistent pain or pressure in the chest New confusion Inability to  wake or stay awake Pale, gray, or blue-colored skin, lips, or nail beds, depending on skin tone *This list is not all possible symptoms. Please call your medical provider for any other symptoms that are severe or concerning to you. Call 911 or call ahead to your local emergency facility: Notify the operator that you are seeking care for someone who has or may have COVID-19. Call ahead before visiting your doctor Call ahead. Many medical visits for routine care are being postponed or done by phone or telemedicine. If you have a medical appointment that cannot be postponed, call your doctor's office, and tell them you have or may have COVID-19. This will help the office protect themselves and other patients. If you are sick, wear a well-fitting mask You should wear a mask if you must be around other people or animals, including pets (even at home). Wear a mask with the best fit, protection, and  comfort for you. You don't need to wear the mask if you are alone. If you can't put on a mask (because of trouble breathing, for example), cover your coughs and sneezes in some other way. Try to stay at least 6 feet away from other people. This will help protect the people around you. Masks should not be placed on young children under age 40 years, anyone who has trouble breathing, or anyone who is not able to remove the mask without help. Cover your coughs and sneezes Cover your mouth and nose with a tissue when you cough or sneeze. Throw away used tissues in a lined trash can. Immediately wash your hands with soap and water for at least 20 seconds. If soap and water are not available, clean your hands with an alcohol-based hand sanitizer that contains at least 60% alcohol. Clean your hands often Wash your hands often with soap and water for at least 20 seconds. This is especially important after blowing your nose, coughing, or sneezing; going to the bathroom; and before eating or preparing food. Use hand sanitizer if soap and water are not available. Use an alcohol-based hand sanitizer with at least 60% alcohol, covering all surfaces of your hands and rubbing them together until they feel dry. Soap and water are the best option, especially if hands are visibly dirty. Avoid touching your eyes, nose, and mouth with unwashed hands. Handwashing Tips Avoid sharing personal household items Do not share dishes, drinking glasses, cups, eating utensils, towels, or bedding with other people in your home. Wash these items thoroughly after using them with soap and water or put in the dishwasher. Clean surfaces in your home regularly Clean and disinfect high-touch surfaces (for example, doorknobs, tables, handles, light switches, and countertops) in your "sick room" and bathroom. In shared spaces, you should clean and disinfect surfaces and items after each use by the person who is ill. If you are sick and  cannot clean, a caregiver or other person should only clean and disinfect the area around you (such as your bedroom and bathroom) on an as needed basis. Your caregiver/other person should wait as long as possible (at least several hours) and wear a mask before entering, cleaning, and disinfecting shared spaces that you use. Clean and disinfect areas that may have blood, stool, or body fluids on them. Use household cleaners and disinfectants. Clean visible dirty surfaces with household cleaners containing soap or detergent. Then, use a household disinfectant. Use a product from [employer] List N: Disinfectants for Coronavirus ([id number]). Be sure to follow the instructions on the  label to ensure safe and effective use of the product. Many products recommend keeping the surface wet with a disinfectant for a certain period of time (look at "contact time" on the product label). You may also need to wear personal protective equipment, such as gloves, depending on the directions on the product label. Immediately after disinfecting, wash your hands with soap and water for 20 seconds. For completed guidance on cleaning and disinfecting your home, visit Complete Disinfection Guidance. Take steps to improve ventilation at home Improve ventilation (air flow) at home to help prevent from spreading COVID-19 to other people in your household. Clear out COVID-19 virus particles in the air by opening windows, using air filters, and turning on fans in your home. Use this interactive tool to learn how to improve air flow in your home. When you can be around others after being sick with COVID-19 Deciding when you can be around others is different for different situations. Find out when you can safely end home isolation. For any additional questions about your care, contact your healthcare provider or state or local health department. 05/19/2020 Content source: [facility] for Immunization and Res[facility] Diseases  ([facility]), Division of Viral Diseases This information is not intended to replace advice given to you by your health care provider. Make sure you discuss any questions you have with your health care provider. Document Revised: 07/02/2020 Document Reviewed: 07/02/2020 Elsevier Patient Education  2022 [employer].    If you have been instructed to have an in-person evaluation today at a local Urgent Care facility, please use the link below. It will take you to a list of all of our available [facility], including address, phone number and hours of operation. Please do not delay care.  [facility]  If you or a family member do not have a primary care provider, use the link below to schedule a visit and establish care. When you choose a [facility] primary care physician or advanced practice provider, you gain a long-term partner in health. Find a Primary Care Provider  Learn more about [facility] in-office and virtual care options: [facility]

## 2022-04-19 NOTE — Telephone Encounter (Signed)
She has been given this already today

## 2022-04-19 NOTE — Progress Notes (Signed)
Virtual Visit Consent   Vanessa Hawkins, you are scheduled for a virtual visit with a [facility] provider today. Just as with appointments in the office, your consent must be obtained to participate. Your consent will be active for this visit and any virtual visit you may have with one of our providers in the next 365 days. If you have a MyChart account, a copy of this consent can be sent to you electronically.  As this is a virtual visit, video technology does not allow for your provider to perform a traditional examination. This may limit your provider's ability to fully assess your condition. If your provider identifies any concerns that need to be evaluated in person or the need to arrange testing (such as labs, EKG, etc.), we will make arrangements to do so. Although advances in technology are sophisticated, we cannot ensure that it will always work on either your end or our end. If the connection with a video visit is poor, the visit may have to be switched to a telephone visit. With either a video or telephone visit, we are not always able to ensure that we have a secure connection.  By engaging in this virtual visit, you consent to the provision of healthcare and authorize for your insurance to be billed (if applicable) for the services provided during this visit. Depending on your insurance coverage, you may receive a charge related to this service.  I need to obtain your verbal consent now. Are you willing to proceed with your visit today? Vanessa Hawkins has provided verbal consent on 04/19/2022 for a virtual visit (video or telephone). Mar Daring, PA-C  Date: 04/19/2022 12:07 PM  Virtual Visit via Video Note   I, Mar Daring, connected with  Vanessa Hawkins  ([id number], [birth year]) on 04/19/22 at 12:00 PM EST by a video-enabled telemedicine application and verified that I am speaking with the correct person using two identifiers.  Location: Patient: Virtual  Visit Location Patient: Home Provider: Virtual Visit Location Provider: Home Office   I discussed the limitations of evaluation and management by telemedicine and the availability of in person appointments. The patient expressed understanding and agreed to proceed.    History of Present Illness: Vanessa Hawkins is a 57 y.o. who identifies as a female who was assigned female at birth, and is being seen today for Covid 47.  HPI: URI  This is a new problem. Episode onset: Tested positive for Covid 19 yesterday; Symptoms started Sunday night. The problem has been gradually worsening. Maximum temperature: Subjective fevers. Associated symptoms include congestion, coughing (dry and productive), headaches, rhinorrhea and sinus pain. Pertinent negatives include no diarrhea, ear pain, nausea, plugged ear sensation, sore throat, vomiting or wheezing. Associated symptoms comments: Body aches, hot and cold flashes, tickle in throat. Treatments tried: Mucinex, dayquil, nyquil, sudafed, tylenol. The treatment provided no relief.      Problems:  Patient Active Problem List   Diagnosis Date Noted   TIA (transient ischemic attack) 09/08/2021   Left sided numbness 08/30/2021   History of fusion of cervical spine 08/30/2021   Hypokalemia 08/30/2021   Chest pain 08/30/2021   Hyponatremia 08/30/2021   Nausea and vomiting 08/30/2021   OAB (overactive bladder) 12/26/2019   Gastroesophageal reflux disease 02/16/2016   Anxiety state, unspecified 06/17/2013   Obesity 09/29/2008   BACK PAIN 09/29/2008   Anxiety and depression 01/19/2007   Essential hypertension 01/19/2007   Allergic rhinitis 01/19/2007   Asthma 01/19/2007   ENLARGEMENT  OF LYMPH NODES 01/19/2007   CHICKENPOX, HX OF 01/19/2007    Allergies:  Allergies  Allergen Reactions   Oxycodone-Acetaminophen Shortness Of Breath and Itching    Shortness of breath.  Can take tylenol   Tyloxapol Shortness Of Breath   Medications:  Current  Outpatient Medications:    nirmatrelvir/ritonavir (PAXLOVID) 20 x 150 MG & 10 x 100MG TABS, Take 3 tablets by mouth 2 (two) times daily for 5 days. (Take nirmatrelvir 150 mg two tablets twice daily for 5 days and ritonavir 100 mg one tablet twice daily for 5 days) Patient GFR is 77, Disp: 30 tablet, Rfl: 0   albuterol (VENTOLIN HFA) 108 (90 Base) MCG/ACT inhaler, INHALE 2 PUFFS BY MOUTH EVERY 4 HOURS AS NEEDED, Disp: 9 g, Rfl: 0   ALPRAZolam (XANAX) 0.5 MG tablet, Take 1 tablet by mouth daily as needed for sleep., Disp: , Rfl:    aspirin 81 MG chewable tablet, Chew 1 tablet (81 mg total) by mouth daily., Disp: , Rfl:    benazepril-hydrochlorthiazide (LOTENSIN HCT) 10-12.5 MG tablet, Take 1 tablet by mouth daily., Disp: 90 tablet, Rfl: 3   buPROPion (WELLBUTRIN XL) 150 MG 24 hr tablet, Take 1 tablet by mouth once daily, Disp: 90 tablet, Rfl: 3   fluticasone (FLONASE) 50 MCG/ACT nasal spray, Place 2 sprays into both nostrils daily., Disp: 48 g, Rfl: 3   fluticasone-salmeterol (ADVAIR HFA) 115-21 MCG/ACT inhaler, Inhale 2 puffs into the lungs 2 (two) times daily., Disp: 1 each, Rfl: 12   montelukast (SINGULAIR) 10 MG tablet, Take 1 tablet (10 mg total) by mouth at bedtime., Disp: 90 tablet, Rfl: 3   solifenacin (VESICARE) 10 MG tablet, Take 1 tablet (10 mg total) by mouth daily., Disp: 90 tablet, Rfl: 3  Observations/Objective: Patient is well-developed, well-nourished in no acute distress.  Resting comfortably at home.  Head is normocephalic, atraumatic.  No labored breathing.  Speech is clear and coherent with logical content.  Patient is alert and oriented at baseline.    Assessment and Plan: 1. COVID-19 - nirmatrelvir/ritonavir (PAXLOVID) 20 x 150 MG & 10 x 100MG TABS; Take 3 tablets by mouth 2 (two) times daily for 5 days. (Take nirmatrelvir 150 mg two tablets twice daily for 5 days and ritonavir 100 mg one tablet twice daily for 5 days) Patient GFR is 77  Dispense: 30 tablet; Refill: 0  -  Continue OTC symptomatic management of choice - Will send OTC vitamins and supplement information through AVS - Paxlovid prescribed - Patient enrolled in MyChart symptom monitoring - Push fluids - Rest as needed - Discussed return precautions and when to seek in-person evaluation, sent via AVS as well   Follow Up Instructions: I discussed the assessment and treatment plan with the patient. The patient was provided an opportunity to ask questions and all were answered. The patient agreed with the plan and demonstrated an understanding of the instructions.  A copy of instructions were sent to the patient via MyChart unless otherwise noted below.    The patient was advised to call back or seek an in-person evaluation if the symptoms worsen or if the condition fails to improve as anticipated.  Time:  I spent 10 minutes with the patient via telehealth technology discussing the above problems/concerns.    Mar Daring, PA-C

## 2022-05-13 HISTORY — PX: COLONOSCOPY: SHX174

## 2022-05-13 LAB — HM COLONOSCOPY

## 2022-08-30 ENCOUNTER — Other Ambulatory Visit: Payer: Self-pay | Admitting: Family Medicine

## 2022-08-30 NOTE — Progress Notes (Unsigned)
ENT CONSULT:  Reason for Consult: hearing loss  hx of ear tubes   Referring Physician:  self-referred   HPI: Vanessa Hawkins is an 57 y.o. female with hx seasonal/environmental allergies, medically treated for years, chronic eustachian tube dysfunction, prior ear tubes (multiple sets, 4-5 times), left side without an ear tube currently, and and right side with an ear tube, who is here for initial evaluation with me due to decreased hearing on the right side to for an ear check.  She was seen by other providers in the past for the same issues. She saw Dr Suszanne Conners, was told R ear tube is still in place, and that L TM perforation has healed. Reports sound is muffled on the right side. Occasional ringing but not all the time, clears with Mucinex. She is on Singulair, albuterol PRN/other inhaler for asthma, Claritin, Flonase for environmental allergies.  No recent ear infections, last one 1 yr ago. She had all ear tubes done in the office. Audio was last year with Dr Suszanne Conners, and reportedly was normal (no records to review).     Records Reviewed:  Hx of thyromegaly  03/22/2016 Office note from [facility]  Exam Ears:  "Cerumen removed from the medial aspect of the right ear canal under otomicroscopy. T tympanostomy tube is in place and patent, no otorrhea or discharge, no middle ear effusion. Left ear shows small posterior tympanic membrane perforation, well marginated without evidence of drainage or cholesteatoma."  "David L. Annalee Genta, M.D. [facility]  Impression & Plans:   The patient presents for follow-up evaluation, she was last seen in January 2015 and has been stable and doing well in her current situation. He has had a several month history of mild symptoms of eustachian tube dysfunction in the right ear. Tympanostomy tube is in place and patent on the right, no evidence of effusion or infection. Left ear has a small stable perforation which is functioning as a tube. No evidence of infection,  no antibiotics or eardrops were prescribed. Continue current regimen of saline nasal spray, fluticasone steroid spray and daily antihistamines. Monitor for symptoms of infection or discharge and follow-up as needed.  PMH/Meds/All/SocHx/FamHx/ROS:   Past Medical History:  Diagnosis Date   Allergic rhinitis   Anxiety   Asthma   Depression   Eustachian tube dysfunction   GERD (gastroesophageal reflux disease)   History of oral hairy leukoplakia   Hypertension   TMJ (dislocation of temporomandibular joint)   Tympanic membrane central perforation   Past Surgical History:  Procedure Laterality Date   Benign neoplasm of soft palate   Cervical Vertebral fusion  2010   HYSTERECTOMY   KNEE SURGERY   MYRINGOTOMY W/ TUBES 2014- R/ear   NECK SURGERY   Neuroplasty decompression median nerve  Carpal Tunnel   SHOULDER SURGERY   TOOTH EXTRACTION"   No family history of bleeding disorders, wound healing problems or difficulty with anesthesia.    Past Medical History:  Diagnosis Date   Allergy    Asthma    Depression    Gynecological examination    sees Dr. Duane Lope    Hypertension     Past Surgical History:  Procedure Laterality Date   ABDOMINAL HYSTERECTOMY     CARPAL TUNNEL RELEASE     CERVICAL DISC SURGERY     c4-5 dr elsner   CHOLECYSTECTOMY     COLONOSCOPY  09/13/2016   per Dr. Wandalee Ferdinand, adenomatous polyps, repeat in 5 years    ROTATOR CUFF REPAIR  01-18-12   left shoulder, per Dr. Madelon Lips     Family History  Problem Relation Age of Onset   Cancer Father        Bladder and kidney   Deep vein thrombosis Father    Deep vein thrombosis Sister    Diabetes Other    Hyperlipidemia Other    Hypertension Other    Uterine cancer Other     Social History:  reports that she quit smoking about 5 years ago. Her smoking use included e-cigarettes and cigarettes. She has a 20.00 pack-year smoking history. She has never used smokeless tobacco. She reports current alcohol use.  She reports that she does not use drugs.  Allergies:  Allergies  Allergen Reactions   Oxycodone-Acetaminophen Shortness Of Breath and Itching    Shortness of breath.  Can take tylenol   Tyloxapol Shortness Of Breath    Medications: I have reviewed the patient's current medications.   The PMH, PSH, Medications, Allergies, and SH were reviewed and updated.  ROS: Constitutional: Negative for fever, weight loss and weight gain. Cardiovascular: Negative for chest pain and dyspnea on exertion. Respiratory: Is not experiencing shortness of breath at rest. Gastrointestinal: Negative for nausea and vomiting. Neurological: Negative for headaches. Psychiatric: The patient is not nervous/anxiou  Blood pressure 127/85, pulse 93, height 5\' 9"  (1.753 m), weight 175 lb (79.4 kg), SpO2 97 %.  PHYSICAL EXAM:  Exam: General: Well-developed, well-nourished Communication and Voice: Clear pitch and clarity Respiratory Respiratory effort: Equal inspiration and expiration without stridor Cardiovascular Peripheral Vascular: Warm extremities with equal color/perfusion Eyes: No nystagmus with equal extraocular motion bilaterally Neuro/Psych/Balance: Patient oriented to person, place, and time; Appropriate mood and affect; Gait is intact with no imbalance; Cranial nerves I-XII are intact Head and Face Inspection: Normocephalic and atraumatic without mass or lesion Palpation: Facial skeleton intact without bony stepoffs Salivary Glands: No mass or tenderness Facial Strength: Facial motility symmetric and full bilaterally ENT Pinna: External ear intact and fully developed External canal: Canal is patent with intact skin Tympanic Membrane: Clear and mobile External Nose: No scar or anatomic deformity Internal Nose: Septum intact and midline. No edema, polyp, or rhinorrhea on anterior rhinoscopy Lips, Teeth, and gums: Mucosa and teeth intact and viable TMJ: No pain to palpation with full  mobility Oral cavity/oropharynx: No erythema or exudate, no lesions present Neck Neck and Trachea: Midline trachea without mass or lesion Thyroid: No mass or nodularity Lymphatics: No lymphadenopathy  Procedure: Cerumen removal on the left, removal of retained ear tube on the right   Procedure: Cerumen Removal, Bilateral (CPT [phone])  Diagnosis: cerumen impaction left side, retained ear tube and TM perforation on the right  Informed consent: Timeout performed and informed consent was obtained.  Procedure: Operating microscope was employed to evaluate the ear(s).  Cerumen curette, speculum and suction were employed to clear the cerumen. We then used a small alligator to grab and remove a T-tube on the right side.  Findings: 40% anterior inferior tympanic membrane perforation on the right side, retained T-tube nearly in canal, removed, normal appearing tympanic membrane on the left without perforations, and external canals are normal after removal of cerumen.No middle ear fluid bilaterally.   Complications: None. Patient tolerated well.   Studies Reviewed:none  Assessment/Plan: Encounter Diagnoses  Name Primary?   Bilateral hearing loss, unspecified hearing loss type Yes   Environmental and seasonal allergies    Dysfunction of both eustachian tubes    Tympanic membrane perforation, left    S/P tympanostomy  tube placement    Impacted cerumen of left ear     56 yoF hx of environmental allergies/ETD s/p multiple sets of tubes, here for evaluation due to muffled hearing on the right side. Last Audio 1 year ago, when she saw Dr Suszanne Conners, but we do not have records to review. Exam with cerumen impaction on the left, removed, no evidence of TM perforation on the left and EAC is dry. Right side with evidence of 40% TM perforation and nearly-dislodged T-tube which we removed today. No effusion in middle ear. Already on nasal spray and Singulair/antihistamine for allergies. Ddx for hearing changes  CHL from TM perf vs SNHL. Will order Audio to evaluate and she will RTC after testing.   - Audio - continue allergy medications and nasal sprays  - RTC after Audio to review  - will discuss TM perforation repair if does not heal on its own or if significant CHL on Audiogram (she reports her left sided perforation took several months to heal, but I am not hopeful it will heal on its own 2/2 40% surface area of the ear drum being involved)  Thank you for allowing me to participate in the care of this patient. Please do not hesitate to contact me with any questions or concerns.   Ashok Croon, MD Otolaryngology [facility] Phone: [phone] Fax: [phone]    08/31/2022, 4:21 PM

## 2022-08-31 ENCOUNTER — Ambulatory Visit (INDEPENDENT_AMBULATORY_CARE_PROVIDER_SITE_OTHER): Payer: 59 | Admitting: Otolaryngology

## 2022-08-31 ENCOUNTER — Other Ambulatory Visit: Payer: Self-pay

## 2022-08-31 ENCOUNTER — Encounter (INDEPENDENT_AMBULATORY_CARE_PROVIDER_SITE_OTHER): Payer: Self-pay | Admitting: Otolaryngology

## 2022-08-31 VITALS — BP 127/85 | HR 93 | Ht 69.0 in | Wt 175.0 lb

## 2022-08-31 DIAGNOSIS — H9193 Unspecified hearing loss, bilateral: Secondary | ICD-10-CM

## 2022-08-31 DIAGNOSIS — H7292 Unspecified perforation of tympanic membrane, left ear: Secondary | ICD-10-CM

## 2022-08-31 DIAGNOSIS — H6993 Unspecified Eustachian tube disorder, bilateral: Secondary | ICD-10-CM

## 2022-08-31 DIAGNOSIS — H7291 Unspecified perforation of tympanic membrane, right ear: Secondary | ICD-10-CM | POA: Diagnosis not present

## 2022-08-31 DIAGNOSIS — J3089 Other allergic rhinitis: Secondary | ICD-10-CM

## 2022-08-31 DIAGNOSIS — H6122 Impacted cerumen, left ear: Secondary | ICD-10-CM

## 2022-08-31 DIAGNOSIS — Z9622 Myringotomy tube(s) status: Secondary | ICD-10-CM

## 2022-08-31 NOTE — Telephone Encounter (Signed)
Pt LOV was on 01/31/22 Last refill was done on 01/31/22 Please advise

## 2022-08-31 NOTE — Patient Instructions (Signed)
-   schedule Audiogram

## 2022-09-05 ENCOUNTER — Encounter (INDEPENDENT_AMBULATORY_CARE_PROVIDER_SITE_OTHER): Payer: Self-pay | Admitting: Otolaryngology

## 2022-09-13 ENCOUNTER — Ambulatory Visit: Payer: 59 | Admitting: Audiologist

## 2022-09-21 ENCOUNTER — Ambulatory Visit (INDEPENDENT_AMBULATORY_CARE_PROVIDER_SITE_OTHER): Payer: 59 | Admitting: Otolaryngology

## 2023-02-02 ENCOUNTER — Other Ambulatory Visit: Payer: Self-pay | Admitting: Family Medicine

## 2023-02-03 ENCOUNTER — Ambulatory Visit (INDEPENDENT_AMBULATORY_CARE_PROVIDER_SITE_OTHER): Payer: 59 | Admitting: Family Medicine

## 2023-02-03 ENCOUNTER — Encounter: Payer: Self-pay | Admitting: Family Medicine

## 2023-02-03 VITALS — BP 110/78 | HR 80 | Temp 97.8°F | Ht 69.0 in | Wt 176.4 lb

## 2023-02-03 DIAGNOSIS — D709 Neutropenia, unspecified: Secondary | ICD-10-CM | POA: Diagnosis not present

## 2023-02-03 DIAGNOSIS — Z0000 Encounter for general adult medical examination without abnormal findings: Secondary | ICD-10-CM | POA: Diagnosis not present

## 2023-02-03 MED ORDER — ZOLPIDEM TARTRATE 10 MG PO TABS: 10.0000 mg | ORAL_TABLET | Freq: Every evening | ORAL | 1 refills | Status: DC | PRN

## 2023-02-03 MED ORDER — MONTELUKAST SODIUM 10 MG PO TABS: 10.0000 mg | ORAL_TABLET | Freq: Every day | ORAL | 3 refills | Status: DC

## 2023-02-03 MED ORDER — ALBUTEROL SULFATE HFA 108 (90 BASE) MCG/ACT IN AERS: INHALATION_SPRAY | RESPIRATORY_TRACT | 5 refills | Status: AC

## 2023-02-03 MED ORDER — SOLIFENACIN SUCCINATE 10 MG PO TABS: 10.0000 mg | ORAL_TABLET | Freq: Every day | ORAL | 3 refills | Status: DC

## 2023-02-03 MED ORDER — BUPROPION HCL ER (XL) 150 MG PO TB24: ORAL_TABLET | ORAL | 3 refills | Status: DC

## 2023-02-03 MED ORDER — BENAZEPRIL-HYDROCHLOROTHIAZIDE 10-12.5 MG PO TABS: 1.0000 | ORAL_TABLET | Freq: Every day | ORAL | 3 refills | Status: DC

## 2023-02-03 NOTE — Progress Notes (Signed)
Subjective:    Patient ID: Vanessa Hawkins, female    DOB: 1965/08/09, 57 y.o.   MRN: [id number]  HPI Here for a well exam. She feels fine physically, but she asks for help with sleep. She has been under stress with her job and with caring for her terminally ill father. She has tried [place] at bedtime, but this no longer helps. She tried Temazepam a few years ago but that did not help.   Review of Systems  Constitutional: Negative.   HENT: Negative.    Eyes: Negative.   Respiratory: Negative.    Cardiovascular: Negative.   Gastrointestinal: Negative.   Genitourinary:  Negative for decreased urine volume, difficulty urinating, dyspareunia, dysuria, enuresis, flank pain, frequency, hematuria, pelvic pain and urgency.  Musculoskeletal: Negative.   Skin: Negative.   Neurological: Negative.  Negative for headaches.  Psychiatric/Behavioral:  Positive for sleep disturbance.        Objective:   Physical Exam Constitutional:      General: She is not in acute distress.    Appearance: Normal appearance. She is well-developed.  HENT:     Head: Normocephalic and atraumatic.     Right Ear: External ear normal.     Left Ear: External ear normal.     Nose: Nose normal.     Mouth/Throat:     Pharynx: No oropharyngeal exudate.  Eyes:     General: No scleral icterus.    Conjunctiva/sclera: Conjunctivae normal.     Pupils: Pupils are equal, round, and reactive to light.  Neck:     Thyroid: No thyromegaly.     Vascular: No JVD.  Cardiovascular:     Rate and Rhythm: Normal rate and regular rhythm.     Pulses: Normal pulses.     Heart sounds: Normal heart sounds. No murmur heard.    No friction rub. No gallop.  Pulmonary:     Effort: Pulmonary effort is normal. No respiratory distress.     Breath sounds: Normal breath sounds. No wheezing or rales.  Chest:     Chest wall: No tenderness.  Abdominal:     General: Bowel sounds are normal. There is no distension.     Palpations: Abdomen  is soft. There is no mass.     Tenderness: There is no abdominal tenderness. There is no guarding or rebound.  Musculoskeletal:        General: No tenderness. Normal range of motion.     Cervical back: Normal range of motion and neck supple.  Lymphadenopathy:     Cervical: No cervical adenopathy.  Skin:    General: Skin is warm and dry.     Findings: No erythema or rash.  Neurological:     Mental Status: She is alert and oriented to person, place, and time.     Cranial Nerves: No cranial nerve deficit.     Motor: No abnormal muscle tone.     Coordination: Coordination normal.     Deep Tendon Reflexes: Reflexes are normal and symmetric. Reflexes normal.  Psychiatric:        Mood and Affect: Mood normal.        Behavior: Behavior normal.        Thought Content: Thought content normal.        Judgment: Judgment normal.           Assessment & Plan:  Well exam. We discussed diet and exercise. Get fasting labs. She will try Zolpidem 10- mg for sleep. Gershon Crane, MD

## 2023-02-07 ENCOUNTER — Other Ambulatory Visit (INDEPENDENT_AMBULATORY_CARE_PROVIDER_SITE_OTHER): Payer: 59

## 2023-02-07 DIAGNOSIS — Z0000 Encounter for general adult medical examination without abnormal findings: Secondary | ICD-10-CM | POA: Diagnosis not present

## 2023-02-07 LAB — BASIC METABOLIC PANEL
BUN: 8 mg/dL (ref 6–23)
CO2: 31 meq/L (ref 19–32)
Calcium: 9.1 mg/dL (ref 8.4–10.5)
Chloride: 98 meq/L (ref 96–112)
Creatinine, Ser: 0.74 mg/dL (ref 0.40–1.20)
GFR: 90.04 mL/min (ref >60.00)
Glucose, Bld: 88 mg/dL (ref 70–99)
Potassium: 3.7 meq/L (ref 3.5–5.1)
Sodium: 135 meq/L (ref 135–145)

## 2023-02-07 LAB — HEPATIC FUNCTION PANEL
ALT: 7 U/L (ref 0–35)
AST: 12 U/L (ref 0–37)
Albumin: 4.2 g/dL (ref 3.5–5.2)
Alkaline Phosphatase: 96 U/L (ref 39–117)
Bilirubin, Direct: 0.1 mg/dL (ref 0.0–0.3)
Total Bilirubin: 0.4 mg/dL (ref 0.2–1.2)
Total Protein: 6.4 g/dL (ref 6.0–8.3)

## 2023-02-07 LAB — LIPID PANEL
Cholesterol: 189 mg/dL (ref 0–200)
HDL: 73 mg/dL (ref >39.00)
LDL Cholesterol: 104 mg/dL — ABNORMAL HIGH (ref 0–99)
NonHDL: 116.42
Total CHOL/HDL Ratio: 3
Triglycerides: 62 mg/dL (ref 0.0–149.0)
VLDL: 12.4 mg/dL (ref 0.0–40.0)

## 2023-02-07 LAB — CBC WITH DIFFERENTIAL/PLATELET
Basophils Absolute: 0 10*3/uL (ref 0.0–0.1)
Basophils Relative: 1 % (ref 0.0–3.0)
Eosinophils Absolute: 0.1 10*3/uL (ref 0.0–0.7)
Eosinophils Relative: 2.9 % (ref 0.0–5.0)
HCT: 38.2 % (ref 36.0–46.0)
Hemoglobin: 13 g/dL (ref 12.0–15.0)
Lymphocytes Relative: 47.3 % — ABNORMAL HIGH (ref 12.0–46.0)
Lymphs Abs: 1.4 10*3/uL (ref 0.7–4.0)
MCHC: 33.9 g/dL (ref 30.0–36.0)
MCV: 96.4 fL (ref 78.0–100.0)
Monocytes Absolute: 0.2 10*3/uL (ref 0.1–1.0)
Monocytes Relative: 6.9 % (ref 3.0–12.0)
Neutro Abs: 1.2 10*3/uL — ABNORMAL LOW (ref 1.4–7.7)
Neutrophils Relative %: 41.9 % — ABNORMAL LOW (ref 43.0–77.0)
Platelets: 361 10*3/uL (ref 150.0–400.0)
RBC: 3.97 Mil/uL (ref 3.87–5.11)
RDW: 12.6 % (ref 11.5–15.5)
WBC: 2.9 10*3/uL — ABNORMAL LOW (ref 4.0–10.5)

## 2023-02-07 LAB — TSH: TSH: 1.8 u[IU]/mL (ref 0.35–5.50)

## 2023-02-07 LAB — HEMOGLOBIN A1C: Hgb A1c MFr Bld: 5.2 % (ref 4.6–6.5)

## 2023-02-08 NOTE — Addendum Note (Signed)
Addended by: Gershon Crane A on: 02/08/2023 05:03 PM   Modules accepted: Orders

## 2023-02-15 ENCOUNTER — Encounter: Payer: Self-pay | Admitting: Family Medicine

## 2023-03-07 LAB — HM MAMMOGRAPHY

## 2023-03-14 ENCOUNTER — Encounter: Payer: Self-pay | Admitting: Oncology

## 2023-03-14 ENCOUNTER — Inpatient Hospital Stay: Payer: 59

## 2023-03-14 ENCOUNTER — Inpatient Hospital Stay: Payer: 59 | Attending: Oncology | Admitting: Oncology

## 2023-03-14 VITALS — BP 126/82 | HR 88 | Temp 97.8°F | Resp 16 | Ht 69.0 in | Wt 176.7 lb

## 2023-03-14 DIAGNOSIS — Z809 Family history of malignant neoplasm, unspecified: Secondary | ICD-10-CM | POA: Insufficient documentation

## 2023-03-14 DIAGNOSIS — Z79620 Long term (current) use of immunosuppressive biologic: Secondary | ICD-10-CM | POA: Diagnosis not present

## 2023-03-14 DIAGNOSIS — D709 Neutropenia, unspecified: Secondary | ICD-10-CM | POA: Diagnosis present

## 2023-03-14 DIAGNOSIS — I10 Essential (primary) hypertension: Secondary | ICD-10-CM | POA: Diagnosis not present

## 2023-03-14 DIAGNOSIS — Z87891 Personal history of nicotine dependence: Secondary | ICD-10-CM | POA: Diagnosis not present

## 2023-03-14 DIAGNOSIS — Z0000 Encounter for general adult medical examination without abnormal findings: Secondary | ICD-10-CM | POA: Insufficient documentation

## 2023-03-14 DIAGNOSIS — Z808 Family history of malignant neoplasm of other organs or systems: Secondary | ICD-10-CM | POA: Diagnosis not present

## 2023-03-14 DIAGNOSIS — Z79899 Other long term (current) drug therapy: Secondary | ICD-10-CM | POA: Diagnosis not present

## 2023-03-14 LAB — VITAMIN B12: Vitamin B-12: 316 pg/mL (ref 180–914)

## 2023-03-14 LAB — CBC WITH DIFFERENTIAL/PLATELET
Abs Immature Granulocytes: 0 10*3/uL (ref 0.00–0.07)
Basophils Absolute: 0 10*3/uL (ref 0.0–0.1)
Basophils Relative: 1 %
Eosinophils Absolute: 0.1 10*3/uL (ref 0.0–0.5)
Eosinophils Relative: 2 %
HCT: 37.9 % (ref 36.0–46.0)
Hemoglobin: 13.3 g/dL (ref 12.0–15.0)
Immature Granulocytes: 0 %
Lymphocytes Relative: 51 %
Lymphs Abs: 1.3 10*3/uL (ref 0.7–4.0)
MCH: 32.3 pg (ref 26.0–34.0)
MCHC: 35.1 g/dL (ref 30.0–36.0)
MCV: 92 fL (ref 80.0–100.0)
Monocytes Absolute: 0.1 10*3/uL (ref 0.1–1.0)
Monocytes Relative: 6 %
Neutro Abs: 1 10*3/uL — ABNORMAL LOW (ref 1.7–7.7)
Neutrophils Relative %: 40 %
Platelets: 317 10*3/uL (ref 150–400)
RBC: 4.12 MIL/uL (ref 3.87–5.11)
RDW: 11.6 % (ref 11.5–15.5)
WBC: 2.5 10*3/uL — ABNORMAL LOW (ref 4.0–10.5)
nRBC: 0 % (ref 0.0–0.2)

## 2023-03-14 LAB — HEPATITIS PANEL, ACUTE
HCV Ab: NONREACTIVE
Hep A IgM: NONREACTIVE
Hep B C IgM: NONREACTIVE
Hepatitis B Surface Ag: NONREACTIVE

## 2023-03-14 LAB — COMPREHENSIVE METABOLIC PANEL
ALT: 8 U/L (ref 0–44)
AST: 15 U/L (ref 15–41)
Albumin: 4.6 g/dL (ref 3.5–5.0)
Alkaline Phosphatase: 99 U/L (ref 38–126)
Anion gap: 5 (ref 5–15)
BUN: 6 mg/dL (ref 6–20)
CO2: 31 mmol/L (ref 22–32)
Calcium: 9.9 mg/dL (ref 8.9–10.3)
Chloride: 96 mmol/L — ABNORMAL LOW (ref 98–111)
Creatinine, Ser: 0.85 mg/dL (ref 0.44–1.00)
GFR, Estimated: >60 mL/min (ref >60)
Glucose, Bld: 87 mg/dL (ref 70–99)
Potassium: 3.7 mmol/L (ref 3.5–5.1)
Sodium: 132 mmol/L — ABNORMAL LOW (ref 135–145)
Total Bilirubin: 0.4 mg/dL (ref 0.0–1.2)
Total Protein: 7.3 g/dL (ref 6.5–8.1)

## 2023-03-14 LAB — LACTATE DEHYDROGENASE: LDH: 155 U/L (ref 98–192)

## 2023-03-14 LAB — RETICULOCYTES
Immature Retic Fract: 8.7 % (ref 2.3–15.9)
RBC.: 4.14 MIL/uL (ref 3.87–5.11)
Retic Count, Absolute: 55.5 10*3/uL (ref 19.0–186.0)
Retic Ct Pct: 1.3 % (ref 0.4–3.1)

## 2023-03-14 LAB — IRON AND TIBC
Iron: 60 ug/dL (ref 28–170)
Saturation Ratios: 16 % (ref 10.4–31.8)
TIBC: 377 ug/dL (ref 250–450)
UIBC: 317 ug/dL

## 2023-03-14 LAB — TSH: TSH: 2.015 u[IU]/mL (ref 0.350–4.500)

## 2023-03-14 LAB — FOLATE: Folate: 12.2 ng/mL (ref >5.9)

## 2023-03-14 LAB — FERRITIN: Ferritin: 182 ng/mL (ref 11–307)

## 2023-03-14 LAB — HIV ANTIBODY (ROUTINE TESTING W REFLEX): HIV Screen 4th Generation wRfx: NONREACTIVE

## 2023-03-14 NOTE — Progress Notes (Signed)
 [facility]  HEMATOLOGY CLINIC CONSULTATION NOTE   PATIENT NAME: Vanessa Hawkins   MR#: [id number] DOB: 1965-04-08  DATE OF SERVICE: 03/14/2023   REFERRING PHYSICIAN  Johnny Garnette LABOR, MD  Patient Care Team: Johnny Garnette LABOR, MD as PCP - General   REASON FOR CONSULTATION/ CHIEF COMPLAINT:  Neutropenia  ASSESSMENT & PLAN:  OYINKANSOLA Hawkins is a 58 y.o. lady with a past medical history of seasonal allergies, hypertension, asthma, anxiety/ nervous, hx of panic attacks, was referred to our service for evaluation of neutropenia.    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    -Mild neutropenia noted on recent labs 02/07/2023 with ANC of 1200.  No recent infections except recurrent vaginal discharge treated as yeast infection.   -Differential diagnosis includes nutritional deficiencies (B12, folic acid , iron), viral infections (hepatitis, HIV, EBV), autoimmune disorders (lupus, rheumatoid arthritis), and bone marrow disorders. Family history of lupus noted. Explained that neutrophils >500 pose a comparable infection risk to the general population.  - Potential benign neutropenia if all tests are normal.  -Labs today revealed white count of 2500 with ANC of 1000, otherwise normal differential.  Hemoglobin, platelet count are normal.  Reticulocyte count is normal.  - Order blood tests for B12, folic acid , iron levels - Order viral infection screening (hepatitis, HIV, EBV) - Order autoimmune screening (rheumatoid factor, ANA) - Order flow cytometry of peripheral blood to assess bone marrow function  - Arrange phone call visit in two weeks to discuss test results  - Schedule follow-up visit in three months for repeat labs   Healthcare maintenance Up to date on mammogram, colonoscopy, and Pap smear. Quit smoking over ten years ago. No recurrent infections, fevers, chills, night sweats, or persistent cough. Menopausal symptoms have  subsided over the last three to five years. - Continue regular annual exams and screenings - Maintain a healthy diet and consider taking a multivitamin if nutrient intake is insufficient.   I reviewed lab results and outside records for this visit and discussed relevant results with the patient. Diagnosis, plan of care and treatment options were also discussed in detail with the patient. Opportunity provided to ask questions and answers provided to her apparent satisfaction. Provided instructions to call our clinic with any problems, questions or concerns prior to return visit. I recommended to continue follow-up with PCP and sub-specialists. She verbalized understanding and agreed with the plan. No barriers to learning was detected.  Chinita Patten, MD  03/14/2023 11:12 AM  [facility] - A DEPT OF [name] [id number] [place] [address] Dept: [phone] Dept Fax: [phone]   HISTORY OF PRESENT ILLNESS:  Discussed the use of AI scribe software for clinical note transcription with the patient, who gave verbal consent to proceed.   Patient had routine labs at her PCPs office on 02/07/2023.  White count was decreased at 2900, ANC 1200, ALC 1400, normal differential otherwise.  Hemoglobin 13, platelet count normal at 361,000.  She was referred to us  for further evaluation of neutropenia.  On review of records, her white count was decreased at 3300 on 01/31/2022, with ANC of 1500 at that time, normal differential.  White count was decreased at 2900 in October 2022, ANC was 1500.  She reports no significant symptoms related to this condition, and has not experienced any recurrent infections, except for recurring vaginal discharge, which she treats as a yeast infection.  She has not started any new medications recently, except for a brief trial of Ambien  for sleep, which she discontinued due to lack of efficacy. She  has a family history of lupus in a sister, and suspects a grandmother may have had rheumatoid arthritis due to hand deformities. She quit smoking over ten years ago, and has no other significant past medical history. She has been diligent about health maintenance, with regular mammograms, colonoscopies, and Pap smears, all of which have been normal. She has not had any recent infections, fevers, chills, night sweats, or recurrent urinary infections. 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  She denies fever, cough, diarrhea, or other infectious symptoms.  She denies epistaxis, bloody stool, melena, hematuria, bruising or other bleeding symptoms. She also denies unintentional weight loss, night sweats or other constitutional symptoms.  MEDICAL HISTORY Past Medical History:  Diagnosis Date   Allergy    Asthma    Depression    Gynecological examination    sees Dr. Dale Gull    Hypertension      SURGICAL HISTORY Past Surgical History:  Procedure Laterality Date   ABDOMINAL HYSTERECTOMY     CARPAL TUNNEL RELEASE     CERVICAL DISC SURGERY     c4-5 dr elsner   CHOLECYSTECTOMY     COLONOSCOPY  05/13/2022   per Dr. Jerrell Sol, no polyps, repeat in 7 yrs   ROTATOR CUFF REPAIR  01/18/2012   left shoulder, per Dr. Shari      SOCIAL HISTORY: She reports that she quit smoking about 5 years ago. Her smoking use included e-cigarettes and cigarettes. She started smoking about 25 years ago. She has a 20 pack-year smoking history. She has never used smokeless tobacco. She reports current alcohol use. She reports that she does not use drugs. Social History   Socioeconomic History   Marital status: Media Planner    Spouse name: Not on file   Number of children: Not on file   Years of education: Not on file   Highest education level: Bachelor's degree (e.g., BA, AB, BS)  Occupational History   Not on file  Tobacco Use   Smoking  status: Former    Current packs/day: 0.00    Average packs/day: 1 pack/day for 20.0 years (20.0 ttl pk-yrs)    Types: E-cigarettes, Cigarettes    Start date: 09/01/1997    Quit date: 09/01/2017    Years since quitting: 5.5   Smokeless tobacco: Never  Substance and Sexual Activity   Alcohol use: Yes    Alcohol/week: 0.0 standard drinks of alcohol    Comment: occ   Drug use: No   Sexual activity: Not on file  Other Topics Concern   Not on file  Social History Narrative   Not on file   Social Drivers of Health   Financial Resource Strain: Low Risk  (02/02/2023)   Overall Financial Resource Strain (CARDIA)    Difficulty of Paying Living Expenses: Not very hard  Food Insecurity: No Food Insecurity (03/14/2023)   Hunger Vital Sign    Worried About Running Out of Food in the Last Year: Never true    Ran Out of Food in the Last Year: Never true  Transportation Needs: No Transportation Needs (03/14/2023)   PRAPARE - Administrator, Civil Service (Medical): No    Lack of Transportation (Non-Medical): No  Physical Activity: Insufficiently Active (02/02/2023)   Exercise Vital Sign    Days of  Exercise per Week: 2 days    Minutes of Exercise per Session: 20 min  Stress: Stress Concern Present (02/02/2023)   [employer] of Occupational Health - Occupational Stress Questionnaire    Feeling of Stress : To some extent  Social Connections: Unknown (02/02/2023)   Social Connection and Isolation Panel [NHANES]    Frequency of Communication with Friends and Family: More than three times a week    Frequency of Social Gatherings with Friends and Family: More than three times a week    Attends Religious Services: Patient declined    Database Administrator or Organizations: No    Attends Engineer, Structural: Not on file    Marital Status: Living with partner  Intimate Partner Violence: Not At Risk (03/14/2023)   Humiliation, Afraid, Rape, and Kick questionnaire    Fear of  Current or Ex-Partner: No    Emotionally Abused: No    Physically Abused: No    Sexually Abused: No    FAMILY HISTORY: Her family history includes Cancer in her father; Deep vein thrombosis in her father and sister; Diabetes in an other family member; Hyperlipidemia in an other family member; Hypertension in an other family member; Uterine cancer in an other family member.  CURRENT MEDICATIONS   Current Outpatient Medications  Medication Instructions   albuterol  (VENTOLIN  HFA) 108 (90 Base) MCG/ACT inhaler INHALE 2 PUFFS BY MOUTH EVERY 4 HOURS AS NEEDED   ALPRAZolam  (XANAX ) 0.5 mg, Oral, 2 times daily   aspirin  81 mg, Oral, Daily   benazepril -hydrochlorthiazide (LOTENSIN  HCT) 10-12.5 MG tablet 1 tablet, Oral, Daily   buPROPion  (WELLBUTRIN  XL) 150 MG 24 hr tablet Take 1 tablet by mouth once daily   fluticasone  (FLONASE ) 50 MCG/ACT nasal spray 2 sprays, Each Nare, Daily   fluticasone -salmeterol (ADVAIR HFA) 115-21 MCG/ACT inhaler 2 puffs, Inhalation, 2 times daily   montelukast  (SINGULAIR ) 10 mg, Oral, Daily at bedtime   solifenacin  (VESICARE ) 10 mg, Oral, Daily   zolpidem  (AMBIEN ) 10 mg, Oral, At bedtime PRN     ALLERGIES  She is allergic to oxycodone-acetaminophen .  REVIEW OF SYSTEMS:  Review of Systems - Oncology   Rest of the pertinent review of systems is unremarkable except as mentioned above in HPI.  PHYSICAL EXAMINATION:  ECOG PERFORMANCE STATUS: 1 - Symptomatic but completely ambulatory  Vitals:   03/14/23 1007  BP: 126/82  Pulse: 88  Resp: 16  Temp: 97.8 F (36.6 C)  SpO2: 99%   Filed Weights   03/14/23 1007  Weight: 176 lb 11.2 oz (80.2 kg)    Physical Exam Constitutional:      General: She is not in acute distress.    Appearance: Normal appearance.  HENT:     Head: Normocephalic and atraumatic.  Eyes:     General: No scleral icterus.    Conjunctiva/sclera: Conjunctivae normal.  Cardiovascular:     Rate and Rhythm: Normal rate and regular rhythm.      Heart sounds: Normal heart sounds.  Pulmonary:     Effort: Pulmonary effort is normal.     Breath sounds: Normal breath sounds.  Abdominal:     General: There is no distension.     Palpations: Abdomen is soft. There is no mass.  Musculoskeletal:     Right lower leg: No edema.     Left lower leg: No edema.  Neurological:     General: No focal deficit present.     Mental Status: She is alert and oriented to person, place,  and time.  Psychiatric:        Mood and Affect: Mood normal.        Behavior: Behavior normal.        Thought Content: Thought content normal.      LABORATORY DATA:   I have reviewed the data as listed.  Results for orders placed or performed in visit on 03/14/23  Reticulocytes  Result Value Ref Range   Retic Ct Pct 1.3 0.4 - 3.1 %   RBC. 4.14 3.87 - 5.11 MIL/uL   Retic Count, Absolute 55.5 19.0 - 186.0 K/uL   Immature Retic Fract 8.7 2.3 - 15.9 %  CBC with Differential/Platelet  Result Value Ref Range   WBC 2.5 (L) 4.0 - 10.5 K/uL   RBC 4.12 3.87 - 5.11 MIL/uL   Hemoglobin 13.3 12.0 - 15.0 g/dL   HCT 62.0 63.9 - 53.9 %   MCV 92.0 80.0 - 100.0 fL   MCH 32.3 26.0 - 34.0 pg   MCHC 35.1 30.0 - 36.0 g/dL   RDW 88.3 88.4 - 84.4 %   Platelets 317 150 - 400 K/uL   nRBC 0.0 0.0 - 0.2 %   Neutrophils Relative % 40 %   Neutro Abs 1.0 (L) 1.7 - 7.7 K/uL   Lymphocytes Relative 51 %   Lymphs Abs 1.3 0.7 - 4.0 K/uL   Monocytes Relative 6 %   Monocytes Absolute 0.1 0.1 - 1.0 K/uL   Eosinophils Relative 2 %   Eosinophils Absolute 0.1 0.0 - 0.5 K/uL   Basophils Relative 1 %   Basophils Absolute 0.0 0.0 - 0.1 K/uL   Immature Granulocytes 0 %   Abs Immature Granulocytes 0.00 0.00 - 0.07 K/uL    RADIOGRAPHIC STUDIES:  No pertinent imaging studies available to review.  Orders Placed This Encounter  Procedures   CBC with Differential/Platelet    Standing Status:   Future    Number of Occurrences:   1    Expiration Date:   03/13/2024   Comprehensive  metabolic panel    Standing Status:   Future    Number of Occurrences:   1    Expiration Date:   03/13/2024   Lactate dehydrogenase    Standing Status:   Future    Number of Occurrences:   1    Expiration Date:   03/13/2024   Iron and TIBC    Standing Status:   Future    Number of Occurrences:   1    Expiration Date:   03/13/2024   Ferritin    Standing Status:   Future    Number of Occurrences:   1    Expiration Date:   03/13/2024   Vitamin B12    Standing Status:   Future    Number of Occurrences:   1    Expiration Date:   03/13/2024   TSH    Standing Status:   Future    Number of Occurrences:   1    Expiration Date:   03/13/2024   Folate    Standing Status:   Future    Number of Occurrences:   1    Expiration Date:   03/13/2024   Rheumatoid factor    Standing Status:   Future    Number of Occurrences:   1    Expiration Date:   03/13/2024   ANA w/Reflex if Positive    Standing Status:   Future    Number of Occurrences:   1  Expiration Date:   03/13/2024   Epstein-Barr Virus Nuclear Antigen Antibody, IGG    Standing Status:   Future    Number of Occurrences:   1    Expiration Date:   03/13/2024   Hepatitis panel, acute    Standing Status:   Future    Number of Occurrences:   1    Expiration Date:   03/13/2024   HIV antibody (with reflex)    Standing Status:   Future    Number of Occurrences:   1    Expiration Date:   03/13/2024   Flow Cytometry, Peripheral Blood (Oncology)    Standing Status:   Future    Number of Occurrences:   1    Expiration Date:   03/13/2024   Reticulocytes    Standing Status:   Future    Number of Occurrences:   1    Expiration Date:   03/13/2024    Future Appointments  Date Time Provider Department Center  03/28/2023  3:00 PM Kimiyo Carmicheal, Chinita, MD [facility] None  06/13/2023 10:45 AM [facility] PHLEBOTOMIST [facility] None  06/13/2023 11:00 AM Kariyah Baugh, MD [facility] None    I spent a total of 55 minutes during this encounter with the patient  including review of chart and various tests results, discussions about plan of care and coordination of care plan.  This document was completed utilizing speech recognition software. Grammatical errors, random word insertions, pronoun errors, and incomplete sentences are an occasional consequence of this system due to software limitations, ambient noise, and hardware issues. Any formal questions or concerns about the content, text or information contained within the body of this dictation should be directly addressed to the provider for clarification.

## 2023-03-14 NOTE — Assessment & Plan Note (Signed)
 Up to date on mammogram, colonoscopy, and Pap smear. Quit smoking over ten years ago. No recurrent infections, fevers, chills, night sweats, or persistent cough. Menopausal symptoms have subsided over the last three to five years. - Continue regular annual exams and screenings - Maintain a healthy diet and consider taking a multivitamin if nutrient intake is insufficient.

## 2023-03-14 NOTE — Assessment & Plan Note (Signed)
 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    -Mild neutropenia noted on recent labs 02/07/2023 with ANC of 1200.  No recent infections except recurrent vaginal discharge treated as yeast infection.   -Differential diagnosis includes nutritional deficiencies (B12, folic acid , iron), viral infections (hepatitis, HIV, EBV), autoimmune disorders (lupus, rheumatoid arthritis), and bone marrow disorders. Family history of lupus noted. Explained that neutrophils >500 pose a comparable infection risk to the general population.  - Potential benign neutropenia if all tests are normal.  -Labs today revealed white count of 2500 with ANC of 1000, otherwise normal differential.  Hemoglobin, platelet count are normal.  Reticulocyte count is normal.  - Order blood tests for B12, folic acid , iron levels - Order viral infection screening (hepatitis, HIV, EBV) - Order autoimmune screening (rheumatoid factor, ANA) - Order flow cytometry of peripheral blood to assess bone marrow function  - Arrange phone call visit in two weeks to discuss test results  - Schedule follow-up visit in three months for repeat labs

## 2023-03-15 LAB — EPSTEIN-BARR VIRUS NUCLEAR ANTIGEN ANTIBODY, IGG: EBV NA IgG: 302 U/mL — ABNORMAL HIGH (ref 0.0–17.9)

## 2023-03-15 LAB — RHEUMATOID FACTOR: Rheumatoid fact SerPl-aCnc: 10.2 [IU]/mL (ref <14.0)

## 2023-03-15 LAB — ANA W/REFLEX IF POSITIVE: Anti Nuclear Antibody (ANA): NEGATIVE

## 2023-03-16 LAB — SURGICAL PATHOLOGY

## 2023-03-22 LAB — FLOW CYTOMETRY

## 2023-03-28 ENCOUNTER — Inpatient Hospital Stay (HOSPITAL_BASED_OUTPATIENT_CLINIC_OR_DEPARTMENT_OTHER): Payer: 59 | Admitting: Oncology

## 2023-03-28 ENCOUNTER — Encounter: Payer: Self-pay | Admitting: Oncology

## 2023-03-28 DIAGNOSIS — D709 Neutropenia, unspecified: Secondary | ICD-10-CM

## 2023-03-28 DIAGNOSIS — Z0000 Encounter for general adult medical examination without abnormal findings: Secondary | ICD-10-CM

## 2023-03-28 NOTE — Progress Notes (Signed)
[facility]  HEMATOLOGY-ONCOLOGY ELECTRONIC VISIT PROGRESS NOTE  PATIENT NAME: Vanessa Hawkins   MR#: [id number] DOB: 31-May-1965  DATE OF SERVICE: 03/28/2023  Patient Care Team: Nelwyn Salisbury, MD as PCP - General  I connected with the patient via telephone conference and verified that I am speaking with the correct person using two identifiers. The patient's location is at home and I am providing care from the [facility].  I discussed the limitations, risks, security and privacy concerns of performing an evaluation and management service by e-visits and the availability of in person appointments.  I also discussed with the patient that there may be a patient responsible charge related to this service. The patient expressed understanding and agreed to proceed.   ASSESSMENT & PLAN:   Vanessa Hawkins is a 58 y.o. lady with a past medical history of seasonal allergies, hypertension, asthma, anxiety/ nervous, hx of panic attacks, was referred to our service in January 2025 for evaluation of neutropenia.    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    -Mild neutropenia noted on recent labs 02/07/2023 with ANC of 1200.  No recent infections except recurrent vaginal discharge treated as yeast infection.   On her initial consult with [place] on 03/14/2023, labs revealed white count of 2500 with ANC of 1000, otherwise normal differential.  Hemoglobin, platelet count and reticulocyte count were normal.  Iron studies, B12, folic acid, TSH, LDH were all within normal limits.  ANA, rheumatoid factor negative.  HIV, hepatitis panel were both negative.  EBV IgG was positive, indicating past infection.  Flow cytometry of peripheral blood was unremarkable.   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  Given overall stable blood counts, we will monitor for now.  Plan to see her again in clinic in April 2025.  If persistent neutropenia or worsening  neutropenia, we will proceed with bone marrow biopsy referral on return visit.   Healthcare maintenance Up to date on mammogram, colonoscopy, and Pap smear. Quit smoking over ten years ago. No recurrent infections, fevers, chills, night sweats, or persistent cough. Menopausal symptoms have subsided over the last three to five years. - Continue regular annual exams and screenings - Maintain a healthy diet and consider taking a multivitamin if nutrient intake is insufficient.   I discussed the assessment and treatment plan with the patient. The patient was provided an opportunity to ask questions and all were answered. The patient agreed with the plan and demonstrated an understanding of the instructions. The patient was advised to call back or seek an in-person evaluation if the symptoms worsen or if the condition fails to improve as anticipated.    I spent 12 minutes over the phone with the patient reviewing test results, discuss management and coordination/planning of care.  Meryl Crutch, MD 03/28/2023 4:53 PM [facility] - A DEPT OF [facility] [address] [place] [address] Dept: [phone] Dept Fax: [phone]   INTERVAL HISTORY:  Please see above for problem oriented charting.  The purpose of today's discussion is to explain recent lab results and to formulate plan of care.  SUMMARY OF HEMATOLOGY HISTORY:  Patient had routine labs at her PCPs office on 02/07/2023.  White count was decreased at 2900, ANC 1200, ALC 1400, normal differential otherwise.  Hemoglobin 13, platelet count normal at 361,000.  She was referred to [place] for further evaluation of neutropenia.  On review of records, her white count was decreased at 3300 on 01/31/2022, with ANC of 1500 at that time, normal differential.  White count was decreased at 2900 in October 2022, ANC was 1500.   She reports no significant symptoms related  to this condition, and has not experienced any recurrent infections, except for recurring vaginal discharge, which she treats as a yeast infection. She has not started any new medications recently, except for a brief trial of Ambien for sleep, which she discontinued due to lack of efficacy. She has a family history of lupus in a sister, and suspects a grandmother may have had rheumatoid arthritis due to hand deformities. She quit smoking over ten years ago, and has no other significant past medical history. She has been diligent about health maintenance, with regular mammograms, colonoscopies, and Pap smears, all of which have been normal. She has not had any recent infections, fevers, chills, night sweats, or recurrent urinary infections. 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  Mild neutropenia noted on recent labs 02/07/2023 with ANC of 1200.  No recent infections except recurrent vaginal discharge treated as yeast infection.   On her initial consult with [place] on 03/14/2023, labs revealed white count of 2500 with ANC of 1000, otherwise normal differential.  Hemoglobin, platelet count and reticulocyte count were normal.  Iron studies, B12, folic acid, TSH, LDH were all within normal limits.  ANA, rheumatoid factor negative.  HIV, hepatitis panel were both negative.  EBV IgG was positive, indicating past infection.  Flow cytometry of peripheral blood was unremarkable.   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  Given overall stable blood counts, we will monitor for now.  Plan to see her again in clinic in April 2025.  If persistent neutropenia or worsening neutropenia, we will proceed with bone marrow biopsy referral on return visit.  REVIEW OF SYSTEMS:    Review of Systems - Oncology  All other pertinent systems were reviewed with the patient and are negative.  I have reviewed the past medical history, past surgical history, social history and  family history with the patient and they are unchanged from previous note.  ALLERGIES:  She is allergic to oxycodone-acetaminophen.  MEDICATIONS:  Current Outpatient Medications  Medication Sig Dispense Refill   albuterol (VENTOLIN HFA) 108 (90 Base) MCG/ACT inhaler INHALE 2 PUFFS BY MOUTH EVERY 4 HOURS AS NEEDED (Patient not taking: Reported on 03/14/2023) 18 g 5   ALPRAZolam (XANAX) 0.5 MG tablet Take 1 tablet by mouth twice daily 60 tablet 5   aspirin 81 MG chewable tablet Chew 1 tablet (81 mg total) by mouth daily.     benazepril-hydrochlorthiazide (LOTENSIN HCT) 10-12.5 MG tablet Take 1 tablet by mouth daily. 90 tablet 3   buPROPion (WELLBUTRIN XL) 150 MG 24 hr tablet Take 1 tablet by mouth once daily 90 tablet 3   fluticasone (FLONASE) 50 MCG/ACT nasal spray Place 2 sprays into both nostrils daily. 48 g 3   fluticasone-salmeterol (ADVAIR HFA) 115-21 MCG/ACT inhaler Inhale 2 puffs into the lungs 2 (two) times daily. 1 each 12   montelukast (SINGULAIR) 10 MG tablet Take 1 tablet (10 mg total) by mouth at bedtime. 90 tablet 3   solifenacin (VESICARE) 10 MG tablet Take 1 tablet (10 mg total) by mouth daily. 90 tablet 3   zolpidem (AMBIEN) 10 MG tablet Take 1 tablet (10 mg total) by mouth at bedtime as needed for sleep. (Patient not taking: Reported  on 03/14/2023) 90 tablet 1   No current facility-administered medications for this visit.    PHYSICAL EXAMINATION: ECOG PERFORMANCE STATUS: 1 - Symptomatic but completely ambulatory  LABORATORY DATA:   I have reviewed the data as listed.  Recent Results (from the past 2160 hours)  Hemoglobin A1c     Status: None   Collection Time: 02/07/23  9:03 AM  Result Value Ref Range   Hgb A1c MFr Bld 5.2 4.6 - 6.5 %    Comment: Glycemic Control Guidelines for People with Diabetes:Non Diabetic:  <6%Goal of Therapy: <7%Additional Action Suggested:  >8%   CBC with Differential/Platelet     Status: Abnormal   Collection Time: 02/07/23  9:03 AM  Result  Value Ref Range   WBC 2.9 (L) 4.0 - 10.5 K/uL   RBC 3.97 3.87 - 5.11 Mil/uL   Hemoglobin 13.0 12.0 - 15.0 g/dL   HCT 16.1 09.6 - 04.5 %   MCV 96.4 78.0 - 100.0 fl   MCHC 33.9 30.0 - 36.0 g/dL   RDW 40.9 81.1 - 91.4 %   Platelets 361.0 150.0 - 400.0 K/uL   Neutrophils Relative % 41.9 (L) 43.0 - 77.0 %   Lymphocytes Relative 47.3 (H) 12.0 - 46.0 %   Monocytes Relative 6.9 3.0 - 12.0 %   Eosinophils Relative 2.9 0.0 - 5.0 %   Basophils Relative 1.0 0.0 - 3.0 %   Neutro Abs 1.2 (L) 1.4 - 7.7 K/uL   Lymphs Abs 1.4 0.7 - 4.0 K/uL   Monocytes Absolute 0.2 0.1 - 1.0 K/uL   Eosinophils Absolute 0.1 0.0 - 0.7 K/uL   Basophils Absolute 0.0 0.0 - 0.1 K/uL  Basic metabolic panel     Status: None   Collection Time: 02/07/23  9:03 AM  Result Value Ref Range   Sodium 135 135 - 145 mEq/L   Potassium 3.7 3.5 - 5.1 mEq/L   Chloride 98 96 - 112 mEq/L   CO2 31 19 - 32 mEq/L   Glucose, Bld 88 70 - 99 mg/dL   BUN 8 6 - 23 mg/dL   Creatinine, Ser 7.82 0.40 - 1.20 mg/dL   GFR 95.62 >13.08 mL/min    Comment: Calculated using the CKD-EPI Creatinine Equation (2021)   Calcium 9.1 8.4 - 10.5 mg/dL  TSH     Status: None   Collection Time: 02/07/23  9:03 AM  Result Value Ref Range   TSH 1.80 0.35 - 5.50 uIU/mL  Hepatic function panel     Status: None   Collection Time: 02/07/23  9:03 AM  Result Value Ref Range   Total Bilirubin 0.4 0.2 - 1.2 mg/dL   Bilirubin, Direct 0.1 0.0 - 0.3 mg/dL   Alkaline Phosphatase 96 39 - 117 U/L   AST 12 0 - 37 U/L   ALT 7 0 - 35 U/L   Total Protein 6.4 6.0 - 8.3 g/dL   Albumin 4.2 3.5 - 5.2 g/dL  Lipid panel     Status: Abnormal   Collection Time: 02/07/23  9:03 AM  Result Value Ref Range   Cholesterol 189 0 - 200 mg/dL    Comment: ATP III Classification       Desirable:  < 200 mg/dL               Borderline High:  200 - 239 mg/dL          High:  > = 657 mg/dL   Triglycerides 84.6 0.0 - 149.0 mg/dL    Comment:  Normal:  <150 mg/dLBorderline High:  150 - 199 mg/dL    HDL 98.11 >91.47 mg/dL   VLDL 82.9 0.0 - 56.2 mg/dL   LDL Cholesterol 130 (H) 0 - 99 mg/dL   Total CHOL/HDL Ratio 3     Comment:                Men          Women1/2 Average Risk     3.4          3.3Average Risk          5.0          4.42X Average Risk          9.6          7.13X Average Risk          15.0          11.0                       NonHDL 116.42     Comment: NOTE:  Non-HDL goal should be 30 mg/dL higher than patient's LDL goal (i.e. LDL goal of < 70 mg/dL, would have non-HDL goal of < 100 mg/dL)  Surgical pathology     Status: None   Collection Time: 03/14/23 12:00 AM  Result Value Ref Range   SURGICAL PATHOLOGY      Surgical Pathology CASE: [id number] PATIENT: Ollen Barges Flow Pathology Report     Clinical history: Neutropenia     DIAGNOSIS:  Peripheral blood, flow cytometry: -  No immunophenotypic evidence of a lymphoproliferative disorder (i.e. no monoclonal B cells or immunophenotypically abnormal T cells detected).  GATING AND PHENOTYPIC ANALYSIS:  Gated population: Flow cytometric immunophenotyping is performed using antibodies to the antigens listed in the table below. Electronic gates are placed around a cell cluster displaying light scatter properties corresponding to: lymphocytes  Abnormal Cells in gated population: N/A  Phenotype of Abnormal Cells: N/A                        Lymphoid Antigens       Myeloid Antigens Miscellaneous CD2  tested    CD10 tested    CD11b     ND   CD45 tested CD3  tested    CD19 tested    CD11c     ND   HLA-Dr    ND CD4  tested    CD20 tested    CD13 ND   CD34 tested CD5  tested    CD22 ND   CD14 ND   CD38 tested C D7  tested    CD79b     ND   CD15 ND   CD138     ND CD8  tested    CD103     ND   CD16 ND   TdT  ND CD25 ND   CD200     tested    CD33 ND   CD123     ND TCRab     ND   sKappa    tested    CD64 ND   CD41 ND TCRgd     tested    sLambda   tested    CD117     ND   CD61 ND CD56 tested    cKappa    ND    MPO  ND   CD71 ND CD57 ND   cLambda   ND  CD235a    ND      GROSS DESCRIPTION:  One lavender top tube submitted from [facility] at [facility] for lymphoma testing.    Final Diagnosis performed by Orene Desanctis DO.   Electronically signed 03/16/2023 Technical and / or Professional components performed at [facility], [address], [place], [place] [address].  The above tests were developed and their performance characteristics determined by the [facility] system for the physical and immunophenotypic characterization of cell populations. They have not been cleared by the U.S. Food and Drug administration. The  FDA has determined that such c learance or approval is not necessary. This test is used for clinical purposes. It should not be  regarded as investigational or for research   Flow Cytometry, Peripheral Blood (Oncology)     Status: None   Collection Time: 03/14/23 10:51 AM  Result Value Ref Range   Flow Cytometry SEE SEPARATE REPORT     Comment: Performed at [facility], [address], [place], [place] [address]  HIV antibody (with reflex)     Status: None   Collection Time: 03/14/23 10:51 AM  Result Value Ref Range   HIV Screen 4th Generation wRfx Non Reactive Non Reactive    Comment: Performed at [facility], [address], [place], [place] [address]  Hepatitis panel, acute     Status: None   Collection Time: 03/14/23 10:51 AM  Result Value Ref Range   Hepatitis B Surface Ag NON REACTIVE NON REACTIVE   HCV Ab NON REACTIVE NON REACTIVE    Comment: (NOTE) Nonreactive HCV antibody screen is consistent with no HCV infections,  unless recent infection is suspected or other evidence exists to indicate HCV infection.     Hep A IgM NON REACTIVE NON REACTIVE   Hep B C IgM NON REACTIVE NON REACTIVE    Comment: Performed at [facility], [address] [place], [place] [address]  Epstein-Barr Virus Nuclear  Antigen Antibody, IGG     Status: Abnormal   Collection Time: 03/14/23 10:51 AM  Result Value Ref Range   EBV NA IgG 302.0 (H) 0.0 - 17.9 U/mL    Comment: (NOTE)                                 Negative        <18.0                                 Equivocal 18.0 - 21.9                                 Positive        >21.9 Performed At: [facility] [address] [place], [place] [id number] Jolene Schimke MD MV:[id number]   ANA w/Reflex if Positive     Status: None   Collection Time: 03/14/23 10:51 AM  Result Value Ref Range   Anti Nuclear Antibody (ANA) Negative Negative    Comment: (NOTE) Performed At: [facility] [address] [place], [place] [id number] Jolene Schimke MD UU:[id number]   Rheumatoid factor     Status: None   Collection Time: 03/14/23 10:51 AM  Result Value Ref Range   Rheumatoid fact SerPl-aCnc 10.2 <14.0 IU/mL    Comment: (NOTE) Performed  At: [facility] [address] [place], [place] [id number] Jolene Schimke MD HQ:[id number]   Folate     Status: None   Collection Time: 03/14/23 10:51 AM  Result Value Ref Range   Folate 12.2 >5.9 ng/mL    Comment: Performed at [facility], [address], [place], [place] [address]  Vitamin B12     Status: None   Collection Time: 03/14/23 10:51 AM  Result Value Ref Range   Vitamin B-12 316 180 - 914 pg/mL    Comment: (NOTE) This assay is not validated for testing neonatal or myeloproliferative syndrome specimens for Vitamin B12 levels. Performed at [facility], [address] [place], [place] [address]   Ferritin     Status: None   Collection Time: 03/14/23 10:51 AM  Result Value Ref Range   Ferritin 182 11 - 307 ng/mL    Comment: Performed at [facility], [address], [place], [place] [address]  Iron and TIBC     Status: None   Collection Time: 03/14/23 10:51 AM  Result Value Ref Range   Iron 60 28 - 170 ug/dL   TIBC 440 347 - 425 ug/dL    Saturation Ratios 16 10.4 - 31.8 %   UIBC 317 ug/dL    Comment: Performed at [facility], [address], [place], [place] [address]  Lactate dehydrogenase     Status: None   Collection Time: 03/14/23 10:51 AM  Result Value Ref Range   LDH 155 98 - 192 U/L    Comment: Performed at [facility], [address] [place], [place] [address]  Comprehensive metabolic panel     Status: Abnormal   Collection Time: 03/14/23 10:51 AM  Result Value Ref Range   Sodium 132 (L) 135 - 145 mmol/L   Potassium 3.7 3.5 - 5.1 mmol/L   Chloride 96 (L) 98 - 111 mmol/L   CO2 31 22 - 32 mmol/L   Glucose, Bld 87 70 - 99 mg/dL    Comment: Glucose reference range applies only to samples taken after fasting for at least 8 hours.   BUN 6 6 - 20 mg/dL   Creatinine, Ser 3.29 0.44 - 1.00 mg/dL   Calcium 9.9 8.9 - 51.8 mg/dL   Total Protein 7.3 6.5 - 8.1 g/dL   Albumin 4.6 3.5 - 5.0 g/dL   AST 15 15 - 41 U/L   ALT 8 0 - 44 U/L   Alkaline Phosphatase 99 38 - 126 U/L   Total Bilirubin 0.4 0.0 - 1.2 mg/dL   GFR, Estimated >84 >16 mL/min    Comment: (NOTE) Calculated using the CKD-EPI Creatinine Equation (2021)    Anion gap 5 5 - 15    Comment: Performed at [facility], [address] [place], [place] [address]  CBC with Differential/Platelet     Status: Abnormal   Collection Time: 03/14/23 10:51 AM  Result Value Ref Range   WBC 2.5 (L) 4.0 - 10.5 K/uL   RBC 4.12 3.87 - 5.11 MIL/uL   Hemoglobin 13.3 12.0 - 15.0 g/dL   HCT 16.0 10.9 - 32.3 %   MCV 92.0 80.0 - 100.0 fL   MCH 32.3 26.0 - 34.0 pg   MCHC 35.1 30.0 - 36.0 g/dL   RDW 55.7 32.2 - 02.5 %   Platelets 317 150 - 400 K/uL   nRBC 0.0 0.0 - 0.2 %   Neutrophils Relative % 40 %   Neutro Abs  1.0 (L) 1.7 - 7.7 K/uL   Lymphocytes Relative 51 %   Lymphs Abs 1.3 0.7 - 4.0 K/uL   Monocytes Relative 6 %   Monocytes Absolute 0.1 0.1 - 1.0 K/uL   Eosinophils Relative 2 %   Eosinophils Absolute 0.1 0.0 - 0.5 K/uL    Basophils Relative 1 %   Basophils Absolute 0.0 0.0 - 0.1 K/uL   Immature Granulocytes 0 %   Abs Immature Granulocytes 0.00 0.00 - 0.07 K/uL    Comment: Performed at [facility], [address] [place], [place] [address]  Reticulocytes     Status: None   Collection Time: 03/14/23 10:52 AM  Result Value Ref Range   Retic Ct Pct 1.3 0.4 - 3.1 %   RBC. 4.14 3.87 - 5.11 MIL/uL   Retic Count, Absolute 55.5 19.0 - 186.0 K/uL   Immature Retic Fract 8.7 2.3 - 15.9 %    Comment: Performed at [facility], [address] [place], [place] [address]  TSH     Status: None   Collection Time: 03/14/23 10:52 AM  Result Value Ref Range   TSH 2.015 0.350 - 4.500 uIU/mL    Comment: Performed by a 3rd Generation assay with a functional sensitivity of <=0.01 uIU/mL. Performed at [facility], [address], [place], [place] [address]      RADIOGRAPHIC STUDIES:  No recent pertinent imaging studies available to review.  No orders of the defined types were placed in this encounter.    Future Appointments  Date Time Provider Department Center  06/13/2023 10:45 AM [facility] PHLEBOTOMIST [facility] None  06/13/2023 11:00 AM Jandiel Magallanes, Archie Patten, MD [facility] None    This document was completed utilizing speech recognition software. Grammatical errors, random word insertions, pronoun errors, and incomplete sentences are an occasional consequence of this system due to software limitations, ambient noise, and hardware issues. Any formal questions or concerns about the content, text or information contained within the body of this dictation should be directly addressed to the provider for clarification.

## 2023-03-28 NOTE — Assessment & Plan Note (Signed)
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    -Mild neutropenia noted on recent labs 02/07/2023 with ANC of 1200.  No recent infections except recurrent vaginal discharge treated as yeast infection.   On her initial consult with [place] on 03/14/2023, labs revealed white count of 2500 with ANC of 1000, otherwise normal differential.  Hemoglobin, platelet count and reticulocyte count were normal.  Iron studies, B12, folic acid, TSH, LDH were all within normal limits.  ANA, rheumatoid factor negative.  HIV, hepatitis panel were both negative.  EBV IgG was positive, indicating past infection.  Flow cytometry of peripheral blood was unremarkable.   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  Given overall stable blood counts, we will monitor for now.  Plan to see her again in clinic in April 2025.  If persistent neutropenia or worsening neutropenia, we will proceed with bone marrow biopsy referral on return visit.

## 2023-03-28 NOTE — Assessment & Plan Note (Signed)
Up to date on mammogram, colonoscopy, and Pap smear. Quit smoking over ten years ago. No recurrent infections, fevers, chills, night sweats, or persistent cough. Menopausal symptoms have subsided over the last three to five years. - Continue regular annual exams and screenings - Maintain a healthy diet and consider taking a multivitamin if nutrient intake is insufficient.

## 2023-03-29 ENCOUNTER — Other Ambulatory Visit: Payer: Self-pay | Admitting: Family Medicine

## 2023-03-31 NOTE — Telephone Encounter (Signed)
Pt LOV was on 02/03/23 Please advise

## 2023-06-08 ENCOUNTER — Other Ambulatory Visit: Payer: Self-pay | Admitting: Oncology

## 2023-06-08 DIAGNOSIS — D709 Neutropenia, unspecified: Secondary | ICD-10-CM

## 2023-06-09 ENCOUNTER — Other Ambulatory Visit: Payer: Self-pay | Admitting: Family Medicine

## 2023-06-13 ENCOUNTER — Telehealth: Payer: Self-pay | Admitting: Oncology

## 2023-06-13 ENCOUNTER — Inpatient Hospital Stay: Payer: 59 | Admitting: Oncology

## 2023-06-13 ENCOUNTER — Inpatient Hospital Stay: Payer: 59 | Attending: Oncology

## 2023-06-13 ENCOUNTER — Encounter: Payer: Self-pay | Admitting: Oncology

## 2023-06-13 VITALS — BP 101/79 | HR 96 | Temp 98.7°F | Resp 17 | Ht 69.0 in | Wt 171.7 lb

## 2023-06-13 DIAGNOSIS — Z0000 Encounter for general adult medical examination without abnormal findings: Secondary | ICD-10-CM

## 2023-06-13 DIAGNOSIS — Z79899 Other long term (current) drug therapy: Secondary | ICD-10-CM | POA: Diagnosis not present

## 2023-06-13 DIAGNOSIS — D709 Neutropenia, unspecified: Secondary | ICD-10-CM

## 2023-06-13 LAB — CBC WITH DIFFERENTIAL (CANCER CENTER ONLY)
Abs Immature Granulocytes: 0.01 10*3/uL (ref 0.00–0.07)
Basophils Absolute: 0 10*3/uL (ref 0.0–0.1)
Basophils Relative: 1 %
Eosinophils Absolute: 0.1 10*3/uL (ref 0.0–0.5)
Eosinophils Relative: 3 %
HCT: 37.3 % (ref 36.0–46.0)
Hemoglobin: 13 g/dL (ref 12.0–15.0)
Immature Granulocytes: 0 %
Lymphocytes Relative: 43 %
Lymphs Abs: 1.4 10*3/uL (ref 0.7–4.0)
MCH: 32.4 pg (ref 26.0–34.0)
MCHC: 34.9 g/dL (ref 30.0–36.0)
MCV: 93 fL (ref 80.0–100.0)
Monocytes Absolute: 0.3 10*3/uL (ref 0.1–1.0)
Monocytes Relative: 9 %
Neutro Abs: 1.5 10*3/uL — ABNORMAL LOW (ref 1.7–7.7)
Neutrophils Relative %: 44 %
Platelet Count: 319 10*3/uL (ref 150–400)
RBC: 4.01 MIL/uL (ref 3.87–5.11)
RDW: 11.4 % — ABNORMAL LOW (ref 11.5–15.5)
WBC Count: 3.3 10*3/uL — ABNORMAL LOW (ref 4.0–10.5)
nRBC: 0 % (ref 0.0–0.2)

## 2023-06-13 LAB — CMP (CANCER CENTER ONLY)
ALT: 10 U/L (ref 0–44)
AST: 15 U/L (ref 15–41)
Albumin: 4.4 g/dL (ref 3.5–5.0)
Alkaline Phosphatase: 95 U/L (ref 38–126)
Anion gap: 6 (ref 5–15)
BUN: 6 mg/dL (ref 6–20)
CO2: 31 mmol/L (ref 22–32)
Calcium: 9.9 mg/dL (ref 8.9–10.3)
Chloride: 95 mmol/L — ABNORMAL LOW (ref 98–111)
Creatinine: 0.71 mg/dL (ref 0.44–1.00)
GFR, Estimated: >60 mL/min (ref >60)
Glucose, Bld: 83 mg/dL (ref 70–99)
Potassium: 3.7 mmol/L (ref 3.5–5.1)
Sodium: 132 mmol/L — ABNORMAL LOW (ref 135–145)
Total Bilirubin: 0.3 mg/dL (ref 0.0–1.2)
Total Protein: 7.5 g/dL (ref 6.5–8.1)

## 2023-06-13 LAB — RETICULOCYTES
Immature Retic Fract: 13.4 % (ref 2.3–15.9)
RBC.: 4 MIL/uL (ref 3.87–5.11)
Retic Count, Absolute: 53.6 10*3/uL (ref 19.0–186.0)
Retic Ct Pct: 1.3 % (ref 0.4–3.1)

## 2023-06-13 LAB — LACTATE DEHYDROGENASE: LDH: 157 U/L (ref 98–192)

## 2023-06-13 NOTE — Progress Notes (Signed)
 [facility]  HEMATOLOGY CLINIC PROGRESS NOTE  PATIENT NAME: Vanessa Hawkins   MR#: [id number] DOB: 1965/07/01  Patient Care Team: Nelwyn Salisbury, MD as PCP - General  Date of visit: 06/13/2023   ASSESSMENT & PLAN:   Vanessa Hawkins is a 58 y.o. lady with a past medical history of seasonal allergies, hypertension, asthma, anxiety/ nervous, hx of panic attacks, was referred to our service in January 2025 for evaluation of neutropenia.    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    -Mild neutropenia noted on recent labs 02/07/2023 with ANC of 1200.  No recent infections except recurrent vaginal discharge treated as yeast infection.   On her initial consult with [place] on 03/14/2023, labs revealed white count of 2500 with ANC of 1000, otherwise normal differential.  Hemoglobin, platelet count and reticulocyte count were normal.  Iron studies, B12, folic acid, TSH, LDH were all within normal limits.  ANA, rheumatoid factor negative.  HIV, hepatitis panel were both negative.  EBV IgG was positive, indicating past infection.  Flow cytometry of peripheral blood was unremarkable.  Labs today showed white count of 3300, improved compared to prior.  ANC is also better at 1500, above the threshold for increased infection risk.   Current lab results show improvement, negating the need for a bone marrow biopsy. Neutrophil count has never dropped below 1,000, minimizing infection risk to that of the general population.  - If current lab tests (serum copper and methylmalonic acid) return abnormal, consider B12 injections.  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  Given overall stable blood counts, we will monitor for now.  Plan to see her again in 4 months.  Space out lab checks to six months if stability is maintained.   Healthcare maintenance Up to date on mammogram, colonoscopy, and Pap smear. Quit smoking over ten years ago. No recurrent  infections, fevers, chills, night sweats, or persistent cough. Menopausal symptoms have subsided over the last three to five years. - Continue regular annual exams and screenings - Maintain a healthy diet and consider taking a multivitamin if nutrient intake is insufficient.   I spent a total of 20 minutes during this encounter with the patient including review of chart and various tests results, discussions about plan of care and coordination of care plan.  I reviewed lab results and outside records for this visit and discussed relevant results with the patient. Diagnosis, plan of care and treatment options were also discussed in detail with the patient. Opportunity provided to ask questions and answers provided to her apparent satisfaction. Provided instructions to call our clinic with any problems, questions or concerns prior to return visit. I recommended to continue follow-up with PCP and sub-specialists. She verbalized understanding and agreed with the plan. No barriers to learning was detected.  Meryl Crutch, MD  06/13/2023 2:35 PM   [facility] - [facility] [address] [place] [address] Dept: [phone] Dept Fax: [phone]   CHIEF COMPLAINT/ REASON FOR VISIT:  Follow-up for chronic neutropenia, idiopathic and benign.  INTERVAL HISTORY:  Discussed the use of AI scribe software for clinical note transcription with the patient, who gave verbal consent to proceed.  History of Present Illness Vanessa Hawkins "Gus Rankin" is a 58 year old female who presents for follow-up of low white blood cell count.  She has experienced allergies and asthma-related symptoms, which she attributes to  the spring season. No major health concerns have been noted in the last three months.  Her white blood cell count was previously low at 2500 in January, but has improved to 3300. A comprehensive workup,  including tests for iron, B12, folic acid, and autoimmune conditions, returned normal results. An EBV test was positive, indicating a past infection. Her neutrophil count has remained above 1000, reducing the risk of infection.  She is currently taking multivitamin tablets and biotin. She has not received B12 injections in the past but used to take a B complex vitamin during menopause.  She confirms that she is eating well, maintaining her weight, and is up to date on her mammogram, colonoscopy, and Pap smear.     SUMMARY OF HEMATOLOGIC HISTORY:  Patient had routine labs at her PCPs office on 02/07/2023.  White count was decreased at 2900, ANC 1200, ALC 1400, normal differential otherwise.  Hemoglobin 13, platelet count normal at 361,000.  She was referred to [place] for further evaluation of neutropenia.   On review of records, her white count was decreased at 3300 on 01/31/2022, with ANC of 1500 at that time, normal differential.  White count was decreased at 2900 in October 2022, ANC was 1500.   She reports no significant symptoms related to this condition, and has not experienced any recurrent infections, except for recurring vaginal discharge, which she treats as a yeast infection. She has not started any new medications recently, except for a brief trial of Ambien for sleep, which she discontinued due to lack of efficacy. She has a family history of lupus in a sister, and suspects a grandmother may have had rheumatoid arthritis due to hand deformities. She quit smoking over ten years ago, and has no other significant past medical history. She has been diligent about health maintenance, with regular mammograms, colonoscopies, and Pap smears, all of which have been normal. She has not had any recent infections, fevers, chills, night sweats, or recurrent urinary infections. 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   Mild neutropenia noted on recent  labs 02/07/2023 with ANC of 1200.  No recent infections except recurrent vaginal discharge treated as yeast infection.    On her initial consult with [place] on 03/14/2023, labs revealed white count of 2500 with ANC of 1000, otherwise normal differential.  Hemoglobin, platelet count and reticulocyte count were normal.  Iron studies, B12, folic acid, TSH, LDH were all within normal limits.  ANA, rheumatoid factor negative.  HIV, hepatitis panel were both negative.  EBV IgG was positive, indicating past infection.  Flow cytometry of peripheral blood was unremarkable.   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  Given overall stable blood counts, we will monitor for now.  Plan to see her again in clinic in April 2025.  If persistent neutropenia or worsening neutropenia, we will proceed with bone marrow biopsy referral on return visit.    I have reviewed the past medical history, past surgical history, social history and family history with the patient and they are unchanged from previous note.  ALLERGIES: She is allergic to oxycodone-acetaminophen.  MEDICATIONS:  Current Outpatient Medications  Medication Sig Dispense Refill   ADVAIR HFA 115-21 MCG/ACT inhaler Inhale 2 puffs by mouth twice daily 12 g 0   albuterol (VENTOLIN HFA) 108 (90 Base) MCG/ACT inhaler INHALE 2 PUFFS BY MOUTH EVERY 4 HOURS AS NEEDED 18 g 5   ALPRAZolam (XANAX) 0.5 MG tablet Take 1 tablet by  mouth twice daily 60 tablet 5   aspirin 81 MG chewable tablet Chew 1 tablet (81 mg total) by mouth daily.     benazepril-hydrochlorthiazide (LOTENSIN HCT) 10-12.5 MG tablet Take 1 tablet by mouth daily. 90 tablet 3   buPROPion (WELLBUTRIN XL) 150 MG 24 hr tablet Take 1 tablet by mouth once daily 90 tablet 3   fluticasone (FLONASE) 50 MCG/ACT nasal spray Place 2 sprays into both nostrils daily. 48 g 3   loratadine (CLARITIN) 10 MG tablet Take 10 mg by mouth daily as needed for allergies.     montelukast (SINGULAIR) 10 MG tablet Take 1 tablet  (10 mg total) by mouth at bedtime. 90 tablet 3   Multiple Vitamin (MULTI VITAMIN DAILY) TABS Take 1 tablet by mouth daily.     solifenacin (VESICARE) 10 MG tablet Take 1 tablet (10 mg total) by mouth daily. 90 tablet 3   zolpidem (AMBIEN) 10 MG tablet Take 1 tablet (10 mg total) by mouth at bedtime as needed for sleep. 90 tablet 1   No current facility-administered medications for this visit.     REVIEW OF SYSTEMS:    Review of Systems - Oncology  All other pertinent systems were reviewed with the patient and are negative.  PHYSICAL EXAMINATION:    Onc Performance Status - 06/13/23 1118       ECOG Perf Status   ECOG Perf Status Fully active, able to carry on all pre-disease performance without restriction      KPS SCALE   KPS % SCORE Normal, no compliants, no evidence of disease             Vitals:   06/13/23 1106  BP: 101/79  Pulse: 96  Resp: 17  Temp: 98.7 F (37.1 C)  SpO2: 100%   Filed Weights   06/13/23 1106  Weight: 171 lb 11.2 oz (77.9 kg)    Physical Exam Constitutional:      General: She is not in acute distress.    Appearance: Normal appearance.  HENT:     Head: Normocephalic and atraumatic.  Eyes:     General: No scleral icterus.    Conjunctiva/sclera: Conjunctivae normal.  Cardiovascular:     Rate and Rhythm: Normal rate and regular rhythm.     Heart sounds: Normal heart sounds.  Pulmonary:     Effort: Pulmonary effort is normal.     Breath sounds: Normal breath sounds.  Abdominal:     General: There is no distension.  Musculoskeletal:     Right lower leg: No edema.     Left lower leg: No edema.  Neurological:     General: No focal deficit present.     Mental Status: She is alert and oriented to person, place, and time.  Psychiatric:        Mood and Affect: Mood normal.        Behavior: Behavior normal.        Thought Content: Thought content normal.     LABORATORY DATA:   I have reviewed the data as listed.  Results for  orders placed or performed in visit on 06/13/23  CMP (Cancer Center only)  Result Value Ref Range   Sodium 132 (L) 135 - 145 mmol/L   Potassium 3.7 3.5 - 5.1 mmol/L   Chloride 95 (L) 98 - 111 mmol/L   CO2 31 22 - 32 mmol/L   Glucose, Bld 83 70 - 99 mg/dL   BUN 6 6 - 20 mg/dL   Creatinine  0.71 0.44 - 1.00 mg/dL   Calcium 9.9 8.9 - 21.3 mg/dL   Total Protein 7.5 6.5 - 8.1 g/dL   Albumin 4.4 3.5 - 5.0 g/dL   AST 15 15 - 41 U/L   ALT 10 0 - 44 U/L   Alkaline Phosphatase 95 38 - 126 U/L   Total Bilirubin 0.3 0.0 - 1.2 mg/dL   GFR, Estimated >08 >65 mL/min   Anion gap 6 5 - 15  Lactate dehydrogenase  Result Value Ref Range   LDH 157 98 - 192 U/L  Reticulocytes  Result Value Ref Range   Retic Ct Pct 1.3 0.4 - 3.1 %   RBC. 4.00 3.87 - 5.11 MIL/uL   Retic Count, Absolute 53.6 19.0 - 186.0 K/uL   Immature Retic Fract 13.4 2.3 - 15.9 %  CBC with Differential (Cancer Center Only)  Result Value Ref Range   WBC Count 3.3 (L) 4.0 - 10.5 K/uL   RBC 4.01 3.87 - 5.11 MIL/uL   Hemoglobin 13.0 12.0 - 15.0 g/dL   HCT 78.4 69.6 - 29.5 %   MCV 93.0 80.0 - 100.0 fL   MCH 32.4 26.0 - 34.0 pg   MCHC 34.9 30.0 - 36.0 g/dL   RDW 28.4 (L) 13.2 - 44.0 %   Platelet Count 319 150 - 400 K/uL   nRBC 0.0 0.0 - 0.2 %   Neutrophils Relative % 44 %   Neutro Abs 1.5 (L) 1.7 - 7.7 K/uL   Lymphocytes Relative 43 %   Lymphs Abs 1.4 0.7 - 4.0 K/uL   Monocytes Relative 9 %   Monocytes Absolute 0.3 0.1 - 1.0 K/uL   Eosinophils Relative 3 %   Eosinophils Absolute 0.1 0.0 - 0.5 K/uL   Basophils Relative 1 %   Basophils Absolute 0.0 0.0 - 0.1 K/uL   Immature Granulocytes 0 %   Abs Immature Granulocytes 0.01 0.00 - 0.07 K/uL    RADIOGRAPHIC STUDIES:  No recent pertinent imaging studies available to review.  Orders Placed This Encounter  Procedures   CBC with Differential (Cancer Center Only)    Standing Status:   Future    Expected Date:   10/13/2023    Expiration Date:   06/12/2024      This  document was completed utilizing speech recognition software. Grammatical errors, random word insertions, pronoun errors, and incomplete sentences are an occasional consequence of this system due to software limitations, ambient noise, and hardware issues. Any formal questions or concerns about the content, text or information contained within the body of this dictation should be directly addressed to the provider for clarification.

## 2023-06-13 NOTE — Telephone Encounter (Signed)
 Contacted pt to schedule an appt. Unable to reach via phone, voicemail was left.     Follow-Up Information  Follow-up disposition: Return in about 4 months (around 10/13/2023) for Lab with 15 min visit.

## 2023-06-13 NOTE — Assessment & Plan Note (Signed)
 Up to date on mammogram, colonoscopy, and Pap smear. Quit smoking over ten years ago. No recurrent infections, fevers, chills, night sweats, or persistent cough. Menopausal symptoms have subsided over the last three to five years. - Continue regular annual exams and screenings - Maintain a healthy diet and consider taking a multivitamin if nutrient intake is insufficient.

## 2023-06-13 NOTE — Assessment & Plan Note (Addendum)
 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    -Mild neutropenia noted on recent labs 02/07/2023 with ANC of 1200.  No recent infections except recurrent vaginal discharge treated as yeast infection.   On her initial consult with us  on 03/14/2023, labs revealed white count of 2500 with ANC of 1000, otherwise normal differential.  Hemoglobin, platelet count and reticulocyte count were normal.  Iron studies, B12, folic acid, TSH, LDH were all within normal limits.  ANA, rheumatoid factor negative.  HIV, hepatitis panel were both negative.  EBV IgG was positive, indicating past infection.  Flow cytometry of peripheral blood was unremarkable.  Labs today showed white count of 3300, improved compared to prior.  ANC is also better at 1500, above the threshold for increased infection risk.   Current lab results show improvement, negating the need for a bone marrow biopsy. Neutrophil count has never dropped below 1,000, minimizing infection risk to that of the general population.  - If current lab tests (serum copper and methylmalonic acid) return abnormal, consider B12 injections.  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  Given overall stable blood counts, we will monitor for now.  Plan to see her again in 4 months.  Space out lab checks to six months if stability is maintained.

## 2023-06-13 NOTE — Telephone Encounter (Signed)
 Patient has been scheduled. Aware of appt date and time.

## 2023-06-15 LAB — COPPER, SERUM: Copper: 125 ug/dL (ref 80–158)

## 2023-06-15 LAB — METHYLMALONIC ACID, SERUM: Methylmalonic Acid, Quantitative: 144 nmol/L (ref 0–378)

## 2023-07-05 ENCOUNTER — Other Ambulatory Visit: Payer: Self-pay | Admitting: Family Medicine

## 2023-08-03 ENCOUNTER — Other Ambulatory Visit: Payer: Self-pay | Admitting: Family Medicine

## 2023-08-04 NOTE — Telephone Encounter (Signed)
 Pt LOV was on 04/05/22 Last refill was done on 02/03/23 Please advise

## 2023-10-01 ENCOUNTER — Other Ambulatory Visit: Payer: Self-pay | Admitting: Family Medicine

## 2023-10-16 ENCOUNTER — Inpatient Hospital Stay: Attending: Oncology | Admitting: Oncology

## 2023-10-16 ENCOUNTER — Encounter: Payer: Self-pay | Admitting: Oncology

## 2023-10-16 ENCOUNTER — Inpatient Hospital Stay

## 2023-10-16 VITALS — BP 118/76 | HR 88 | Temp 97.9°F | Resp 18 | Ht 69.0 in | Wt 168.0 lb

## 2023-10-16 DIAGNOSIS — D709 Neutropenia, unspecified: Secondary | ICD-10-CM | POA: Diagnosis present

## 2023-10-16 DIAGNOSIS — Z79899 Other long term (current) drug therapy: Secondary | ICD-10-CM | POA: Diagnosis not present

## 2023-10-16 DIAGNOSIS — Z87891 Personal history of nicotine dependence: Secondary | ICD-10-CM | POA: Diagnosis not present

## 2023-10-16 DIAGNOSIS — Z0000 Encounter for general adult medical examination without abnormal findings: Secondary | ICD-10-CM

## 2023-10-16 LAB — CBC WITH DIFFERENTIAL (CANCER CENTER ONLY)
Abs Immature Granulocytes: 0 K/uL (ref 0.00–0.07)
Basophils Absolute: 0 K/uL (ref 0.0–0.1)
Basophils Relative: 1 %
Eosinophils Absolute: 0.1 K/uL (ref 0.0–0.5)
Eosinophils Relative: 3 %
HCT: 37.4 % (ref 36.0–46.0)
Hemoglobin: 12.8 g/dL (ref 12.0–15.0)
Immature Granulocytes: 0 %
Lymphocytes Relative: 54 %
Lymphs Abs: 1.6 K/uL (ref 0.7–4.0)
MCH: 32.2 pg (ref 26.0–34.0)
MCHC: 34.2 g/dL (ref 30.0–36.0)
MCV: 94 fL (ref 80.0–100.0)
Monocytes Absolute: 0.2 K/uL (ref 0.1–1.0)
Monocytes Relative: 8 %
Neutro Abs: 1 K/uL — ABNORMAL LOW (ref 1.7–7.7)
Neutrophils Relative %: 34 %
Platelet Count: 300 K/uL (ref 150–400)
RBC: 3.98 MIL/uL (ref 3.87–5.11)
RDW: 11.8 % (ref 11.5–15.5)
WBC Count: 3 K/uL — ABNORMAL LOW (ref 4.0–10.5)
nRBC: 0 % (ref 0.0–0.2)

## 2023-10-16 NOTE — Progress Notes (Signed)
 [facility]  HEMATOLOGY CLINIC PROGRESS NOTE  PATIENT NAME: Vanessa Hawkins   MR#: [id number] DOB: 1965-11-23  Patient Care Team: [facility] LABOR, MD as PCP - General  Date of visit: 10/16/2023   ASSESSMENT & PLAN:   Vanessa Hawkins is a 58 y.o. lady with a past medical history of seasonal allergies, hypertension, asthma, anxiety/ nervous, hx of panic attacks, was referred to our service in January 2025 for evaluation of neutropenia.    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    -Mild neutropenia noted on recent labs 02/07/2023 with ANC of 1200.  No recent infections except recurrent vaginal discharge treated as yeast infection.   On her initial consult with us  on 03/14/2023, labs revealed white count of 2500 with ANC of 1000, otherwise normal differential.  Hemoglobin, platelet count and reticulocyte count were normal.  Iron studies, B12, folic acid , TSH, LDH were all within normal limits.  ANA, rheumatoid factor negative.  HIV, hepatitis panel were both negative.  EBV IgG was positive, indicating past infection.  Flow cytometry of peripheral blood was unremarkable.  Serum copper  level, methylmalonic acid were all within normal limits in April 2025.  Labs today showed white count of 3000, stable compared to prior.  ANC is also stable at 1000.  Current lab results show stability, negating the need for a bone marrow biopsy. Neutrophil count has never dropped below 1,000, minimizing infection risk to that of the general population.  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  Given overall stable blood counts, we will monitor for now.  Plan to see her again in 4 months.  Space out lab checks to six months if stability is maintained.  Healthcare maintenance Up to date on mammogram, colonoscopy, and Pap smear. Quit smoking over ten years ago. No recurrent infections, fevers, chills, night sweats, or persistent cough. Menopausal symptoms  have subsided over the last three to five years. - Continue regular annual exams and screenings - Maintain a healthy diet and consider taking a multivitamin if nutrient intake is insufficient.   I spent a total of 20 minutes during this encounter with the patient including review of chart and various tests results, discussions about plan of care and coordination of care plan.  I reviewed lab results and outside records for this visit and discussed relevant results with the patient. Diagnosis, plan of care and treatment options were also discussed in detail with the patient. Opportunity provided to ask questions and answers provided to her apparent satisfaction. Provided instructions to call our clinic with any problems, questions or concerns prior to return visit. I recommended to continue follow-up with PCP and sub-specialists. She verbalized understanding and agreed with the plan. No barriers to learning was detected.  Vanessa Patten, MD  10/16/2023 12:00 PM  [facility] - A [facility] [id number] [place] [address] Dept: [phone] Dept Fax: [phone]   CHIEF COMPLAINT/ REASON FOR VISIT:  Follow-up for chronic neutropenia, idiopathic and benign.  INTERVAL HISTORY:  Discussed the use of AI scribe software for clinical note transcription with the patient, who gave verbal consent to proceed.  History of Present Illness  Vanessa Hawkins is a 58 year old female with benign neutropenia who presents for routine follow-up.  Her white blood cell count has been fluctuating between 2,500 and 3,300 over the past few years, with the most recent  count being 3,000. Neutrophil count has remained at 1,000 or more. No infections, fevers, chills, or night sweats have been experienced.  Approximately three years ago, she experienced what she believes was a panic attack, initially thought to be a  heart attack, leading to a hospital visit. During this visit, she was switched from cardiac to stroke protocol due to an elevated D-dimer level. She was placed on blood thinners for 21 days and then transitioned to daily aspirin . She inquired about the necessity of including a D-dimer test in her current labs, as a previous neurologist recommended it.  She experiences intermittent sciatica, with symptoms including soreness in her hip and calf, which she attributes to sciatic nerve issues.  Her family history includes blood clotting issues, as both her sister and father have a history of blood clots.   SUMMARY OF HEMATOLOGIC HISTORY:  Patient had routine labs at her PCPs office on 02/07/2023.  White count was decreased at 2900, ANC 1200, ALC 1400, normal differential otherwise.  Hemoglobin 13, platelet count normal at 361,000.  She was referred to us  for further evaluation of neutropenia.   On review of records, her white count was decreased at 3300 on 01/31/2022, with ANC of 1500 at that time, normal differential.  White count was decreased at 2900 in October 2022, ANC was 1500.   She reports no significant symptoms related to this condition, and has not experienced any recurrent infections, except for recurring vaginal discharge, which she treats as a yeast infection. She has not started any new medications recently, except for a brief trial of Ambien  for sleep, which she discontinued due to lack of efficacy. She has a family history of lupus in a sister, and suspects a grandmother may have had rheumatoid arthritis due to hand deformities. She quit smoking over ten years ago, and has no other significant past medical history. She has been diligent about health maintenance, with regular mammograms, colonoscopies, and Pap smears, all of which have been normal. She has not had any recent infections, fevers, chills, night sweats, or recurrent urinary infections. 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   Mild neutropenia noted on recent labs 02/07/2023 with ANC of 1200.  No recent infections except recurrent vaginal discharge treated as yeast infection.    On her initial consult with us  on 03/14/2023, labs revealed white count of 2500 with ANC of 1000, otherwise normal differential.  Hemoglobin, platelet count and reticulocyte count were normal.  Iron studies, B12, folic acid , TSH, LDH were all within normal limits.  ANA, rheumatoid factor negative.  HIV, hepatitis panel were both negative.  EBV IgG was positive, indicating past infection.  Flow cytometry of peripheral blood was unremarkable.   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  Given overall stable blood counts, we will monitor for now. If worsening neutropenia, we will arrange for bone marrow biopsy.  Currently no indication for this.    I have reviewed the past medical history, past surgical history, social history and family history with the patient and they are unchanged from previous note.  ALLERGIES: She is allergic to oxycodone-acetaminophen .  MEDICATIONS:  Current Outpatient Medications  Medication Sig Dispense Refill   ADVAIR HFA 115-21 MCG/ACT inhaler INHALE 2 PUFFS INTO LUNGS TWICE DAILY 12 g 0   albuterol  (VENTOLIN  HFA) 108 (90 Base) MCG/ACT inhaler INHALE 2 PUFFS BY MOUTH EVERY 4 HOURS AS NEEDED 18 g 5   ALPRAZolam  (XANAX ) 0.5 MG tablet  Take 1 tablet by mouth twice daily 60 tablet 5   aspirin  81 MG chewable tablet Chew 1 tablet (81 mg total) by mouth daily.     benazepril -hydrochlorthiazide (LOTENSIN  HCT) 10-12.5 MG tablet Take 1 tablet by mouth daily. 90 tablet 3   buPROPion  (WELLBUTRIN  XL) 150 MG 24 hr tablet Take 1 tablet by mouth once daily 90 tablet 3   fluticasone  (FLONASE ) 50 MCG/ACT nasal spray Place 2 sprays into both nostrils daily. 48 g 3   loratadine (CLARITIN) 10 MG tablet Take 10 mg by mouth daily as needed for allergies.     MAGNESIUM GLYCINATE PO  Take 1 tablet by mouth daily.     montelukast  (SINGULAIR ) 10 MG tablet Take 1 tablet (10 mg total) by mouth at bedtime. 90 tablet 3   Multiple Vitamin (MULTI VITAMIN DAILY) TABS Take 1 tablet by mouth daily.     solifenacin  (VESICARE ) 10 MG tablet Take 1 tablet (10 mg total) by mouth daily. 90 tablet 3   zolpidem  (AMBIEN ) 10 MG tablet TAKE 1 TABLET BY MOUTH AT BEDTIME AS NEEDED FOR SLEEP 30 tablet 5   No current facility-administered medications for this visit.     REVIEW OF SYSTEMS:    Review of Systems - Oncology  All other pertinent systems were reviewed with the patient and are negative.  PHYSICAL EXAMINATION:    Onc Performance Status - 10/16/23 1042       ECOG Perf Status   ECOG Perf Status Restricted in physically strenuous activity but ambulatory and able to carry out work of a light or sedentary nature, e.g., light house work, office work      KPS SCALE   KPS % SCORE Able to carry on normal activity, minor s/s of disease           Vitals:   10/16/23 1028  BP: 118/76  Pulse: 88  Resp: 18  Temp: 97.9 F (36.6 C)  SpO2: 100%    Filed Weights   10/16/23 1028  Weight: 168 lb (76.2 kg)     Physical Exam Constitutional:      General: She is not in acute distress.    Appearance: Normal appearance.  HENT:     Head: Normocephalic and atraumatic.  Eyes:     General: No scleral icterus.    Conjunctiva/sclera: Conjunctivae normal.  Cardiovascular:     Rate and Rhythm: Normal rate and regular rhythm.     Heart sounds: Normal heart sounds.  Pulmonary:     Effort: Pulmonary effort is normal.     Breath sounds: Normal breath sounds.  Abdominal:     General: There is no distension.  Musculoskeletal:     Right lower leg: No edema.     Left lower leg: No edema.  Neurological:     General: No focal deficit present.     Mental Status: She is alert and oriented to person, place, and time.  Psychiatric:        Mood and Affect: Mood normal.        Behavior:  Behavior normal.        Thought Content: Thought content normal.     LABORATORY DATA:   I have reviewed the data as listed.  Results for orders placed or performed in visit on 10/16/23  CBC with Differential (Cancer Center Only)  Result Value Ref Range   WBC Count 3.0 (L) 4.0 - 10.5 K/uL   RBC 3.98 3.87 - 5.11 MIL/uL   Hemoglobin 12.8 12.0 -  15.0 g/dL   HCT 62.5 63.9 - 53.9 %   MCV 94.0 80.0 - 100.0 fL   MCH 32.2 26.0 - 34.0 pg   MCHC 34.2 30.0 - 36.0 g/dL   RDW 88.1 88.4 - 84.4 %   Platelet Count 300 150 - 400 K/uL   nRBC 0.0 0.0 - 0.2 %   Neutrophils Relative % 34 %   Neutro Abs 1.0 (L) 1.7 - 7.7 K/uL   Lymphocytes Relative 54 %   Lymphs Abs 1.6 0.7 - 4.0 K/uL   Monocytes Relative 8 %   Monocytes Absolute 0.2 0.1 - 1.0 K/uL   Eosinophils Relative 3 %   Eosinophils Absolute 0.1 0.0 - 0.5 K/uL   Basophils Relative 1 %   Basophils Absolute 0.0 0.0 - 0.1 K/uL   Immature Granulocytes 0 %   Abs Immature Granulocytes 0.00 0.00 - 0.07 K/uL     RADIOGRAPHIC STUDIES:  No recent pertinent imaging studies available to review.  Orders Placed This Encounter  Procedures   CBC with Differential (Cancer Center Only)    Standing Status:   Future    Expiration Date:   10/15/2024   D-dimer, quantitative    Standing Status:   Future    Expiration Date:   10/15/2024      This document was completed utilizing speech recognition software. Grammatical errors, random word insertions, pronoun errors, and incomplete sentences are an occasional consequence of this system due to software limitations, ambient noise, and hardware issues. Any formal questions or concerns about the content, text or information contained within the body of this dictation should be directly addressed to the provider for clarification.

## 2023-10-16 NOTE — Assessment & Plan Note (Signed)
 Up to date on mammogram, colonoscopy, and Pap smear. Quit smoking over ten years ago. No recurrent infections, fevers, chills, night sweats, or persistent cough. Menopausal symptoms have subsided over the last three to five years. - Continue regular annual exams and screenings - Maintain a healthy diet and consider taking a multivitamin if nutrient intake is insufficient.

## 2023-10-16 NOTE — Assessment & Plan Note (Addendum)
 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    -Mild neutropenia noted on recent labs 02/07/2023 with ANC of 1200.  No recent infections except recurrent vaginal discharge treated as yeast infection.   On her initial consult with us  on 03/14/2023, labs revealed white count of 2500 with ANC of 1000, otherwise normal differential.  Hemoglobin, platelet count and reticulocyte count were normal.  Iron studies, B12, folic acid , TSH, LDH were all within normal limits.  ANA, rheumatoid factor negative.  HIV, hepatitis panel were both negative.  EBV IgG was positive, indicating past infection.  Flow cytometry of peripheral blood was unremarkable.  Serum copper  level, methylmalonic acid were all within normal limits in April 2025.  Labs today showed white count of 3000, stable compared to prior.  ANC is also stable at 1000.  Current lab results show stability, negating the need for a bone marrow biopsy. Neutrophil count has never dropped below 1,000, minimizing infection risk to that of the general population.  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  Given overall stable blood counts, we will monitor for now.  Plan to see her again in 4 months.  Space out lab checks to six months if stability is maintained.

## 2023-10-19 ENCOUNTER — Telehealth: Payer: Self-pay | Admitting: Oncology

## 2023-10-19 NOTE — Telephone Encounter (Signed)
 Patient has been scheduled for follow-up visit per 10/16/23 LOS.  Pt aware of scheduled appt details.

## 2024-02-05 ENCOUNTER — Ambulatory Visit: Admitting: Family Medicine

## 2024-02-05 ENCOUNTER — Ambulatory Visit: Payer: Self-pay | Admitting: Family Medicine

## 2024-02-05 ENCOUNTER — Encounter: Payer: Self-pay | Admitting: Family Medicine

## 2024-02-05 VITALS — BP 118/78 | HR 86 | Temp 98.2°F | Ht 67.13 in | Wt 170.6 lb

## 2024-02-05 DIAGNOSIS — G4730 Sleep apnea, unspecified: Secondary | ICD-10-CM | POA: Diagnosis not present

## 2024-02-05 DIAGNOSIS — Z0000 Encounter for general adult medical examination without abnormal findings: Secondary | ICD-10-CM

## 2024-02-05 DIAGNOSIS — Z209 Contact with and (suspected) exposure to unspecified communicable disease: Secondary | ICD-10-CM | POA: Diagnosis not present

## 2024-02-05 LAB — HEPATIC FUNCTION PANEL
ALT: 12 U/L (ref 0–35)
AST: 18 U/L (ref 0–37)
Albumin: 4.4 g/dL (ref 3.5–5.2)
Alkaline Phosphatase: 109 U/L (ref 39–117)
Bilirubin, Direct: 0 mg/dL (ref 0.0–0.3)
Total Bilirubin: 0.4 mg/dL (ref 0.2–1.2)
Total Protein: 7.3 g/dL (ref 6.0–8.3)

## 2024-02-05 LAB — BASIC METABOLIC PANEL WITH GFR
BUN: 9 mg/dL (ref 6–23)
CO2: 30 meq/L (ref 19–32)
Calcium: 9.4 mg/dL (ref 8.4–10.5)
Chloride: 98 meq/L (ref 96–112)
Creatinine, Ser: 0.78 mg/dL (ref 0.40–1.20)
GFR: 83.94 mL/min (ref >60.00)
Glucose, Bld: 75 mg/dL (ref 70–99)
Potassium: 3.8 meq/L (ref 3.5–5.1)
Sodium: 136 meq/L (ref 135–145)

## 2024-02-05 LAB — LIPID PANEL
Cholesterol: 207 mg/dL — ABNORMAL HIGH (ref 0–200)
HDL: 90.8 mg/dL (ref >39.00)
LDL Cholesterol: 105 mg/dL — ABNORMAL HIGH (ref 0–99)
NonHDL: 116.63
Total CHOL/HDL Ratio: 2
Triglycerides: 58 mg/dL (ref 0.0–149.0)
VLDL: 11.6 mg/dL (ref 0.0–40.0)

## 2024-02-05 LAB — CBC WITH DIFFERENTIAL/PLATELET
Basophils Absolute: 0 K/uL (ref 0.0–0.1)
Basophils Relative: 1.4 % (ref 0.0–3.0)
Eosinophils Absolute: 0.1 K/uL (ref 0.0–0.7)
Eosinophils Relative: 3.2 % (ref 0.0–5.0)
HCT: 37.5 % (ref 36.0–46.0)
Hemoglobin: 12.9 g/dL (ref 12.0–15.0)
Lymphocytes Relative: 50.7 % — ABNORMAL HIGH (ref 12.0–46.0)
Lymphs Abs: 1.2 K/uL (ref 0.7–4.0)
MCHC: 34.5 g/dL (ref 30.0–36.0)
MCV: 95.2 fl (ref 78.0–100.0)
Monocytes Absolute: 0.2 K/uL (ref 0.1–1.0)
Monocytes Relative: 7.9 % (ref 3.0–12.0)
Neutro Abs: 0.9 K/uL — ABNORMAL LOW (ref 1.4–7.7)
Neutrophils Relative %: 36.8 % — ABNORMAL LOW (ref 43.0–77.0)
Platelets: 327 K/uL (ref 150.0–400.0)
RBC: 3.93 Mil/uL (ref 3.87–5.11)
RDW: 12.5 % (ref 11.5–15.5)
WBC: 2.4 K/uL — ABNORMAL LOW (ref 4.0–10.5)

## 2024-02-05 LAB — HEMOGLOBIN A1C: Hgb A1c MFr Bld: 4.9 % (ref 4.6–6.5)

## 2024-02-05 LAB — TSH: TSH: 1.27 u[IU]/mL (ref 0.35–5.50)

## 2024-02-05 MED ORDER — SOLIFENACIN SUCCINATE 10 MG PO TABS: 10.0000 mg | ORAL_TABLET | Freq: Every day | ORAL | 3 refills | Status: AC

## 2024-02-05 MED ORDER — ZOLPIDEM TARTRATE 10 MG PO TABS: 10.0000 mg | ORAL_TABLET | Freq: Every evening | ORAL | 5 refills | Status: AC | PRN

## 2024-02-05 MED ORDER — MONTELUKAST SODIUM 10 MG PO TABS: 10.0000 mg | ORAL_TABLET | Freq: Every day | ORAL | 3 refills | Status: AC

## 2024-02-05 MED ORDER — BENAZEPRIL-HYDROCHLOROTHIAZIDE 10-12.5 MG PO TABS: 1.0000 | ORAL_TABLET | Freq: Every day | ORAL | 3 refills | Status: AC

## 2024-02-05 MED ORDER — BUPROPION HCL ER (XL) 150 MG PO TB24: ORAL_TABLET | ORAL | 3 refills | Status: AC

## 2024-02-05 NOTE — Progress Notes (Signed)
 Subjective:    Patient ID: Vanessa Hawkins, female    DOB: [birth year], 58 y.o.   MRN: [id number]  HPI:  Here for a well exam. She has one concern to address. She sometimes wakes herself up during the night feeling like she cannot catch her breath. Her partner says she stop s breathing occasionally while she sleeps. She has lost some weight in the past year, and this has helped.   Review of Systems  Constitutional: Negative.   HENT: Negative.    Eyes: Negative.   Respiratory: Negative.    Cardiovascular: Negative.   Gastrointestinal: Negative.   Genitourinary:  Negative for decreased urine volume, difficulty urinating, dyspareunia, dysuria, enuresis, flank pain, frequency, hematuria, pelvic pain and urgency.  Musculoskeletal: Negative.   Skin: Negative.   Neurological: Negative.  Negative for headaches.  Psychiatric/Behavioral: Negative.         Objective:   Physical Exam Constitutional:      General: She is not in acute distress.    Appearance: Normal appearance. She is well-developed.  HENT:     Head: Normocephalic and atraumatic.     Right Ear: External ear normal.     Left Ear: External ear normal.     Nose: Nose normal.     Mouth/Throat:     Pharynx: No oropharyngeal exudate.  Eyes:     General: No scleral icterus.    Conjunctiva/sclera: Conjunctivae normal.     Pupils: Pupils are equal, round, and reactive to light.  Neck:     Thyroid : No thyromegaly.     Vascular: No JVD.  Cardiovascular:     Rate and Rhythm: Normal rate and regular rhythm.     Pulses: Normal pulses.     Heart sounds: Normal heart sounds. No murmur heard.    No friction rub. No gallop.  Pulmonary:     Effort: Pulmonary effort is normal. No respiratory distress.     Breath sounds: Normal breath sounds. No wheezing or rales.  Chest:     Chest wall: No tenderness.  Abdominal:     General: Bowel sounds are normal. There is no distension.     Palpations: Abdomen is soft. There is no mass.      Tenderness: There is no abdominal tenderness. There is no guarding or rebound.  Musculoskeletal:        General: No tenderness. Normal range of motion.     Cervical back: Normal range of motion and neck supple.  Lymphadenopathy:     Cervical: No cervical adenopathy.  Skin:    General: Skin is warm and dry.     Findings: No erythema or rash.  Neurological:     General: No focal deficit present.     Mental Status: She is alert and oriented to person, place, and time.     Cranial Nerves: No cranial nerve deficit.     Motor: No abnormal muscle tone.     Coordination: Coordination normal.     Deep Tendon Reflexes: Reflexes are normal and symmetric. Reflexes normal.  Psychiatric:        Mood and Affect: Mood normal.        Behavior: Behavior normal.        Thought Content: Thought content normal.        Judgment: Judgment normal.           Assessment & Plan:  Well exam. We discussed diet and exercise. Get fasting labs. She likely has sleep apnea, so we will refer  her to Pulmonary.  Garnette Olmsted, MD

## 2024-02-06 LAB — HEPATITIS B SURFACE ANTIBODY,QUALITATIVE: Hep B S Ab: NONREACTIVE

## 2024-02-12 ENCOUNTER — Inpatient Hospital Stay

## 2024-02-12 ENCOUNTER — Inpatient Hospital Stay: Attending: Oncology | Admitting: Oncology

## 2024-02-12 VITALS — BP 127/85 | HR 96 | Temp 97.1°F | Resp 15 | Wt 171.4 lb

## 2024-02-12 DIAGNOSIS — Z8041 Family history of malignant neoplasm of ovary: Secondary | ICD-10-CM | POA: Insufficient documentation

## 2024-02-12 DIAGNOSIS — D709 Neutropenia, unspecified: Secondary | ICD-10-CM | POA: Insufficient documentation

## 2024-02-12 DIAGNOSIS — Z0000 Encounter for general adult medical examination without abnormal findings: Secondary | ICD-10-CM | POA: Diagnosis not present

## 2024-02-12 DIAGNOSIS — I10 Essential (primary) hypertension: Secondary | ICD-10-CM | POA: Diagnosis not present

## 2024-02-12 DIAGNOSIS — Z79899 Other long term (current) drug therapy: Secondary | ICD-10-CM | POA: Diagnosis not present

## 2024-02-12 DIAGNOSIS — Z87891 Personal history of nicotine dependence: Secondary | ICD-10-CM | POA: Insufficient documentation

## 2024-02-12 LAB — CBC WITH DIFFERENTIAL (CANCER CENTER ONLY)
Abs Immature Granulocytes: 0 K/uL (ref 0.00–0.07)
Basophils Absolute: 0 K/uL (ref 0.0–0.1)
Basophils Relative: 1 %
Eosinophils Absolute: 0.1 K/uL (ref 0.0–0.5)
Eosinophils Relative: 5 %
HCT: 37.5 % (ref 36.0–46.0)
Hemoglobin: 13 g/dL (ref 12.0–15.0)
Immature Granulocytes: 0 %
Lymphocytes Relative: 50 %
Lymphs Abs: 1.4 K/uL (ref 0.7–4.0)
MCH: 32.4 pg (ref 26.0–34.0)
MCHC: 34.7 g/dL (ref 30.0–36.0)
MCV: 93.5 fL (ref 80.0–100.0)
Monocytes Absolute: 0.2 K/uL (ref 0.1–1.0)
Monocytes Relative: 7 %
Neutro Abs: 1 K/uL — ABNORMAL LOW (ref 1.7–7.7)
Neutrophils Relative %: 37 %
Platelet Count: 276 K/uL (ref 150–400)
RBC: 4.01 MIL/uL (ref 3.87–5.11)
RDW: 11.6 % (ref 11.5–15.5)
WBC Count: 2.8 K/uL — ABNORMAL LOW (ref 4.0–10.5)
nRBC: 0 % (ref 0.0–0.2)

## 2024-02-12 LAB — D-DIMER, QUANTITATIVE: D-Dimer, Quant: 0.48 ug{FEU}/mL (ref 0.00–0.50)

## 2024-02-12 NOTE — Assessment & Plan Note (Signed)
 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    -Mild neutropenia noted on recent labs 02/07/2023 with ANC of 1200.  No recent infections except recurrent vaginal discharge treated as yeast infection.   On her initial consult with us  on 03/14/2023, labs revealed white count of 2500 with ANC of 1000, otherwise normal differential.  Hemoglobin, platelet count and reticulocyte count were normal.  Iron studies, B12, folic acid , TSH, LDH were all within normal limits.  ANA, rheumatoid factor negative.  HIV, hepatitis panel were both negative.  EBV IgG was positive, indicating past infection.  Flow cytometry of peripheral blood was unremarkable.  Serum copper  level, methylmalonic acid were all within normal limits in April 2025.  Labs today showed white count of 3000, stable compared to prior.  ANC is also stable at 1000.  Current lab results show stability, negating the need for a bone marrow biopsy. Neutrophil count has never dropped below 1,000, minimizing infection risk to that of the general population.  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  Given overall stable blood counts, we will monitor for now.  Plan to see her again in 4 months.  Space out lab checks to six months if stability is maintained.

## 2024-02-12 NOTE — Assessment & Plan Note (Signed)
 Up to date on mammogram, colonoscopy, and Pap smear. Quit smoking over ten years ago. No recurrent infections, fevers, chills, night sweats, or persistent cough. Menopausal symptoms have subsided over the last three to five years. - Continue regular annual exams and screenings - Maintain a healthy diet and consider taking a multivitamin if nutrient intake is insufficient.

## 2024-02-12 NOTE — Progress Notes (Unsigned)
 [facility]  HEMATOLOGY CLINIC PROGRESS NOTE  PATIENT NAME: Vanessa Hawkins   MR#: [id number] DOB: 13-Jun-1965  Patient Care Team: Johnny Garnette LABOR, MD as PCP - Diedre Okey Leader, MD as Consulting Physician (Obstetrics and Gynecology)  Date of visit: 02/12/2024   ASSESSMENT & PLAN:   Vanessa Hawkins is a 58 y.o. lady with a past medical history of seasonal allergies, hypertension, asthma, anxiety/ nervous, hx of panic attacks, was referred to our service in January 2025 for evaluation of neutropenia.    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    -Mild neutropenia noted on recent labs 02/07/2023 with ANC of 1200.  No recent infections except recurrent vaginal discharge treated as yeast infection.   On her initial consult with us  on 03/14/2023, labs revealed white count of 2500 with ANC of 1000, otherwise normal differential.  Hemoglobin, platelet count and reticulocyte count were normal.  Iron studies, B12, folic acid , TSH, LDH were all within normal limits.  ANA, rheumatoid factor negative.  HIV, hepatitis panel were both negative.  EBV IgG was positive, indicating past infection.  Flow cytometry of peripheral blood was unremarkable.  Serum copper  level, methylmalonic acid were all within normal limits in April 2025.  Labs today showed white count of 3000, stable compared to prior.  ANC is also stable at 1000.  Current lab results show stability, negating the need for a bone marrow biopsy. Neutrophil count has never dropped below 1,000, minimizing infection risk to that of the general population.  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  Given overall stable blood counts, we will monitor for now.  Plan to see her again in 4 months.  Space out lab checks to six months if stability is maintained.  Healthcare maintenance Up to date on mammogram, colonoscopy, and Pap smear. Quit smoking over ten years ago. No recurrent infections,  fevers, chills, night sweats, or persistent cough. Menopausal symptoms have subsided over the last three to five years. - Continue regular annual exams and screenings - Maintain a healthy diet and consider taking a multivitamin if nutrient intake is insufficient.   I spent a total of 20 minutes during this encounter with the patient including review of chart and various tests results, discussions about plan of care and coordination of care plan.  I reviewed lab results and outside records for this visit and discussed relevant results with the patient. Diagnosis, plan of care and treatment options were also discussed in detail with the patient. Opportunity provided to ask questions and answers provided to her apparent satisfaction. Provided instructions to call our clinic with any problems, questions or concerns prior to return visit. I recommended to continue follow-up with PCP and sub-specialists. She verbalized understanding and agreed with the plan. No barriers to learning was detected.  Chinita Patten, MD  02/12/2024 10:11 AM  [facility] - A DEPT OF [name] [address] [place] [address] Dept: [phone] Dept Fax: [phone]   CHIEF COMPLAINT/ REASON FOR VISIT:  Follow-up for chronic neutropenia, idiopathic and benign.  INTERVAL HISTORY:  Discussed the use of AI scribe software for clinical note transcription with the patient, who gave verbal consent to proceed.  History of Present Illness  Vanessa Hawkins is a 58 year old female with benign neutropenia who presents for routine follow-up.  Her white blood cell count has been fluctuating between 2,500 and 3,300 over  the past few years, with the most recent count being 3,000. Neutrophil count has remained at 1,000 or more. No infections, fevers, chills, or night sweats have been experienced.  Approximately three years ago, she  experienced what she believes was a panic attack, initially thought to be a heart attack, leading to a hospital visit. During this visit, she was switched from cardiac to stroke protocol due to an elevated D-dimer level. She was placed on blood thinners for 21 days and then transitioned to daily aspirin . She inquired about the necessity of including a D-dimer test in her current labs, as a previous neurologist recommended it.  She experiences intermittent sciatica, with symptoms including soreness in her hip and calf, which she attributes to sciatic nerve issues.  Her family history includes blood clotting issues, as both her sister and father have a history of blood clots.   SUMMARY OF HEMATOLOGIC HISTORY:  Patient had routine labs at her PCPs office on 02/07/2023.  White count was decreased at 2900, ANC 1200, ALC 1400, normal differential otherwise.  Hemoglobin 13, platelet count normal at 361,000.  She was referred to us  for further evaluation of neutropenia.   On review of records, her white count was decreased at 3300 on 01/31/2022, with ANC of 1500 at that time, normal differential.  White count was decreased at 2900 in October 2022, ANC was 1500.   She reports no significant symptoms related to this condition, and has not experienced any recurrent infections, except for recurring vaginal discharge, which she treats as a yeast infection. She has not started any new medications recently, except for a brief trial of Ambien  for sleep, which she discontinued due to lack of efficacy. She has a family history of lupus in a sister, and suspects a grandmother may have had rheumatoid arthritis due to hand deformities. She quit smoking over ten years ago, and has no other significant past medical history. She has been diligent about health maintenance, with regular mammograms, colonoscopies, and Pap smears, all of which have been normal. She has not had any recent infections, fevers, chills, night sweats, or  recurrent urinary infections. 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   Mild neutropenia noted on recent labs 02/07/2023 with ANC of 1200.  No recent infections except recurrent vaginal discharge treated as yeast infection.    On her initial consult with us  on 03/14/2023, labs revealed white count of 2500 with ANC of 1000, otherwise normal differential.  Hemoglobin, platelet count and reticulocyte count were normal.  Iron studies, B12, folic acid , TSH, LDH were all within normal limits.  ANA, rheumatoid factor negative.  HIV, hepatitis panel were both negative.  EBV IgG was positive, indicating past infection.  Flow cytometry of peripheral blood was unremarkable.   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  Given overall stable blood counts, we will monitor for now. If worsening neutropenia, we will arrange for bone marrow biopsy.  Currently no indication for this.   I have reviewed the past medical history, past surgical history, social history and family history with the patient and they are unchanged from previous note.  ALLERGIES: She is allergic to oxycodone-acetaminophen .  MEDICATIONS:  Current Outpatient Medications  Medication Sig Dispense Refill   ADVAIR HFA 115-21 MCG/ACT inhaler INHALE 2 PUFFS INTO LUNGS TWICE DAILY 12 g 0   albuterol  (VENTOLIN  HFA) 108 (90 Base) MCG/ACT inhaler INHALE 2 PUFFS BY MOUTH EVERY 4 HOURS AS NEEDED 18 g 5  ALPRAZolam  (XANAX ) 0.5 MG tablet Take 1 tablet by mouth twice daily 60 tablet 5   aspirin  81 MG chewable tablet Chew 1 tablet (81 mg total) by mouth daily.     benazepril -hydrochlorthiazide (LOTENSIN  HCT) 10-12.5 MG tablet Take 1 tablet by mouth daily. 90 tablet 3   buPROPion  (WELLBUTRIN  XL) 150 MG 24 hr tablet Take 1 tablet by mouth once daily 90 tablet 3   fluticasone  (FLONASE ) 50 MCG/ACT nasal spray Place 2 sprays into both nostrils daily. 48 g 3   loratadine (CLARITIN) 10 MG tablet Take 10 mg  by mouth daily as needed for allergies.     METAMUCIL FIBER PO Take 1 Dose by mouth daily as needed (Constipation).     Misc Natural Products (OSTEO BI-FLEX TRIPLE STRENGTH PO) Take 2 tablets by mouth daily.     montelukast  (SINGULAIR ) 10 MG tablet Take 1 tablet (10 mg total) by mouth at bedtime. 90 tablet 3   Multiple Vitamin (MULTI VITAMIN DAILY) TABS Take 1 tablet by mouth daily.     solifenacin  (VESICARE ) 10 MG tablet Take 1 tablet (10 mg total) by mouth daily. 90 tablet 3   zolpidem  (AMBIEN ) 10 MG tablet Take 1 tablet (10 mg total) by mouth at bedtime as needed. for sleep 30 tablet 5   No current facility-administered medications for this visit.     REVIEW OF SYSTEMS:    Review of Systems - Oncology  All other pertinent systems were reviewed with the patient and are negative.  PHYSICAL EXAMINATION:    Onc Performance Status - 02/12/24 0956       ECOG Perf Status   ECOG Perf Status Restricted in physically strenuous activity but ambulatory and able to carry out work of a light or sedentary nature, e.g., light house work, office work      KPS SCALE   KPS % SCORE Normal activity with effort, some s/s of disease          Vitals:   02/12/24 0939  BP: 127/85  Pulse: 96  Resp: 15  Temp: (!) 97.1 F (36.2 C)  SpO2: 100%   Filed Weights   02/12/24 0939  Weight: 171 lb 6.4 oz (77.7 kg)    Physical Exam Constitutional:      General: She is not in acute distress.    Appearance: Normal appearance.  HENT:     Head: Normocephalic and atraumatic.  Eyes:     General: No scleral icterus.    Conjunctiva/sclera: Conjunctivae normal.  Cardiovascular:     Rate and Rhythm: Normal rate and regular rhythm.     Heart sounds: Normal heart sounds.  Pulmonary:     Effort: Pulmonary effort is normal.     Breath sounds: Normal breath sounds.  Abdominal:     General: There is no distension.  Musculoskeletal:     Right lower leg: No edema.     Left lower leg: No edema.   Neurological:     General: No focal deficit present.     Mental Status: She is alert and oriented to person, place, and time.  Psychiatric:        Mood and Affect: Mood normal.        Behavior: Behavior normal.        Thought Content: Thought content normal.     LABORATORY DATA:   I have reviewed the data as listed.  Results for orders placed or performed in visit on 02/12/24  D-dimer, quantitative  Result Value Ref Range  D-Dimer, Quant 0.48 0.00 - 0.50 ug/mL-FEU  CBC with Differential (Cancer Center Only)  Result Value Ref Range   WBC Count 2.8 (L) 4.0 - 10.5 K/uL   RBC 4.01 3.87 - 5.11 MIL/uL   Hemoglobin 13.0 12.0 - 15.0 g/dL   HCT 62.4 63.9 - 53.9 %   MCV 93.5 80.0 - 100.0 fL   MCH 32.4 26.0 - 34.0 pg   MCHC 34.7 30.0 - 36.0 g/dL   RDW 88.3 88.4 - 84.4 %   Platelet Count 276 150 - 400 K/uL   nRBC 0.0 0.0 - 0.2 %   Neutrophils Relative % 37 %   Neutro Abs 1.0 (L) 1.7 - 7.7 K/uL   Lymphocytes Relative 50 %   Lymphs Abs 1.4 0.7 - 4.0 K/uL   Monocytes Relative 7 %   Monocytes Absolute 0.2 0.1 - 1.0 K/uL   Eosinophils Relative 5 %   Eosinophils Absolute 0.1 0.0 - 0.5 K/uL   Basophils Relative 1 %   Basophils Absolute 0.0 0.0 - 0.1 K/uL   Immature Granulocytes 0 %   Abs Immature Granulocytes 0.00 0.00 - 0.07 K/uL     RADIOGRAPHIC STUDIES:  No recent pertinent imaging studies available to review.  Orders Placed This Encounter  Procedures   CBC with Differential (Cancer Center Only)    Standing Status:   Future    Expected Date:   08/12/2024    Expiration Date:   02/11/2025     Future Appointments  Date Time Provider Department Center  02/26/2024  9:15 AM [facility] [address]  03/19/2024  8:30 AM Neda Jennet LABOR, MD [facility] [phone] LELON Das   This document was completed utilizing speech recognition software. Grammatical errors, random word insertions, pronoun errors, and incomplete sentences are an occasional consequence of this system due to  software limitations, ambient noise, and hardware issues. Any formal questions or concerns about the content, text or information contained within the body of this dictation should be directly addressed to the provider for clarification.

## 2024-02-13 ENCOUNTER — Encounter: Payer: Self-pay | Admitting: Oncology

## 2024-02-26 ENCOUNTER — Ambulatory Visit (INDEPENDENT_AMBULATORY_CARE_PROVIDER_SITE_OTHER)

## 2024-02-26 DIAGNOSIS — Z23 Encounter for immunization: Secondary | ICD-10-CM

## 2024-02-26 NOTE — Progress Notes (Signed)
 Patient is in office today for a nurse visit for  HEPLISAV-B Immunization. Patient Injection was given in the  Right deltoid. Patient tolerated injection well.

## 2024-03-19 ENCOUNTER — Ambulatory Visit: Admitting: Pulmonary Disease

## 2024-03-19 ENCOUNTER — Encounter: Payer: Self-pay | Admitting: Pulmonary Disease

## 2024-03-19 VITALS — BP 114/79 | HR 81 | Ht 69.0 in | Wt 173.0 lb

## 2024-03-19 DIAGNOSIS — R0683 Snoring: Secondary | ICD-10-CM

## 2024-03-19 DIAGNOSIS — R0602 Shortness of breath: Secondary | ICD-10-CM | POA: Diagnosis not present

## 2024-03-19 DIAGNOSIS — Z87891 Personal history of nicotine dependence: Secondary | ICD-10-CM

## 2024-03-19 DIAGNOSIS — R911 Solitary pulmonary nodule: Secondary | ICD-10-CM

## 2024-03-19 DIAGNOSIS — F5104 Psychophysiologic insomnia: Secondary | ICD-10-CM | POA: Diagnosis not present

## 2024-03-19 DIAGNOSIS — G478 Other sleep disorders: Secondary | ICD-10-CM

## 2024-03-19 DIAGNOSIS — R9389 Abnormal findings on diagnostic imaging of other specified body structures: Secondary | ICD-10-CM

## 2024-03-19 NOTE — Progress Notes (Addendum)
 "              Vanessa Hawkins    [id number]    Aug 04, 1965  Primary Care Physician:Fry, Garnette LABOR, MD  Referring Physician: Johnny Garnette LABOR, MD [address] [place],  [place] [address]  Chief complaint:   Patient being seen for concerns about sleep Shortness of breath when falling asleep, told about apneas, gasping respiration at night  Discussed the use of AI scribe software for clinical note transcription with the patient, who gave verbal consent to proceed.  History of Present Illness Vanessa Hawkins is a 59 year old female who presents with concerns of sleep apnea and shortness of breath. She was referred by Dr. Johnny for evaluation of possible sleep apnea.  She experiences shortness of breath as she is going to sleep, which has become unnerving. Her partner has indicated that she sometimes sounds like she is holding her breath in her sleep. She goes to bed around 10 or 11 PM and takes about an hour to fall asleep. She uses Ambien  occasionally when having a restless night, but not every night, and reports that it sometimes helps her sleep. She also has a prescription for Xanax , which she used to take at night, but it became ineffective, leading to the prescription of Ambien . She wakes up feeling rested only about 30-40% of the time and often experiences dryness of the mouth upon waking. No morning headaches or night sweats. She does not feel excessively sleepy during the day but describes her memory as 'terrible' and concerning. She uses GPS for navigation due to memory issues.  She has a history of a 6 mm lung nodule discovered in 2020 or 2021, which was supposed to be followed up with a CT scan after two years, but she missed the appointment.  She has been seeing a hematologist for benign neutropenia for a couple of years.  She has a history of smoking, having quit in 2015.  Goes to bed about 10 to 11 PM Takes about an hour sometimes to fall asleep 2-3  awakenings Final wake up time between 7 and 730 weight is down about 10 pounds Quit smoking in 2015  History of TIA, hypertension Asthma   Outpatient Encounter Medications as of 03/19/2024  Medication Sig   ADVAIR HFA 115-21 MCG/ACT inhaler INHALE 2 PUFFS INTO LUNGS TWICE DAILY   albuterol  (VENTOLIN  HFA) 108 (90 Base) MCG/ACT inhaler INHALE 2 PUFFS BY MOUTH EVERY 4 HOURS AS NEEDED   ALPRAZolam  (XANAX ) 0.5 MG tablet Take 1 tablet by mouth twice daily   aspirin  81 MG chewable tablet Chew 1 tablet (81 mg total) by mouth daily.   benazepril -hydrochlorthiazide (LOTENSIN  HCT) 10-12.5 MG tablet Take 1 tablet by mouth daily.   buPROPion  (WELLBUTRIN  XL) 150 MG 24 hr tablet Take 1 tablet by mouth once daily   fluticasone  (FLONASE ) 50 MCG/ACT nasal spray Place 2 sprays into both nostrils daily.   loratadine (CLARITIN) 10 MG tablet Take 10 mg by mouth daily as needed for allergies.   METAMUCIL FIBER PO Take 1 Dose by mouth daily as needed (Constipation).   Misc Natural Products (OSTEO BI-FLEX TRIPLE STRENGTH PO) Take 2 tablets by mouth daily.   montelukast  (SINGULAIR ) 10 MG tablet Take 1 tablet (10 mg total) by mouth at bedtime.   Multiple Vitamin (MULTI VITAMIN DAILY) TABS Take 1 tablet by mouth daily.   solifenacin  (VESICARE ) 10 MG tablet Take 1 tablet (10 mg total) by mouth daily.  zolpidem  (AMBIEN ) 10 MG tablet Take 1 tablet (10 mg total) by mouth at bedtime as needed. for sleep   No facility-administered encounter medications on file as of 03/19/2024.    Allergies as of 03/19/2024 - Review Complete 03/19/2024  Allergen Reaction Noted   Codeine Anaphylaxis 03/19/2024   Oxycodone-acetaminophen  Shortness Of Breath and Itching 01/19/2007   Tyloxapol Shortness Of Breath 08/30/2021    Past Medical History:  Diagnosis Date   Allergy    Asthma    Depression    Gynecological examination    sees Dr. Dale Gull    Hypertension     Past Surgical History:  Procedure Laterality Date    ABDOMINAL HYSTERECTOMY     CARPAL TUNNEL RELEASE     CERVICAL DISC SURGERY     c4-5 dr elsner   CHOLECYSTECTOMY     COLONOSCOPY  05/13/2022   per Dr. Jerrell Sol, no polyps, repeat in 7 yrs   ROTATOR CUFF REPAIR  01/18/2012   left shoulder, per Dr. Shari     Family History  Problem Relation Age of Onset   Cancer Father        Bladder and kidney   Deep vein thrombosis Father    Deep vein thrombosis Sister    Diabetes Other    Hyperlipidemia Other    Hypertension Other    Uterine cancer Other     Social History   Socioeconomic History   Marital status: Media Planner    Spouse name: Not on file   Number of children: Not on file   Years of education: Not on file   Highest education level: Bachelor's degree (e.g., BA, AB, BS)  Occupational History   Not on file  Tobacco Use   Smoking status: Former    Current packs/day: 0.00    Average packs/day: 1 pack/day for 20.0 years (20.0 ttl pk-yrs)    Types: E-cigarettes, Cigarettes    Start date: 09/01/1997    Quit date: 09/01/2017    Years since quitting: 6.5   Smokeless tobacco: Never  Substance and Sexual Activity   Alcohol use: Yes    Alcohol/week: 0.0 standard drinks of alcohol    Comment: occ   Drug use: No   Sexual activity: Not on file  Other Topics Concern   Not on file  Social History Narrative   Not on file   Social Drivers of Health   Tobacco Use: Medium Risk (03/19/2024)   Patient History    Smoking Tobacco Use: Former    Smokeless Tobacco Use: Never    Passive Exposure: Not on file  Financial Resource Strain: Low Risk (02/01/2024)   Overall Financial Resource Strain (CARDIA)    Difficulty of Paying Living Expenses: Not very hard  Food Insecurity: No Food Insecurity (02/01/2024)   Epic    Worried About Radiation Protection Practitioner of Food in the Last Year: Never true    Ran Out of Food in the Last Year: Never true  Transportation Needs: No Transportation Needs (02/01/2024)   Epic    Lack of Transportation  (Medical): No    Lack of Transportation (Non-Medical): No  Physical Activity: Insufficiently Active (02/01/2024)   Exercise Vital Sign    Days of Exercise per Week: 2 days    Minutes of Exercise per Session: 30 min  Stress: Stress Concern Present (02/01/2024)   [employer] of Occupational Health - Occupational Stress Questionnaire    Feeling of Stress: Very much  Social Connections: Moderately Integrated (02/01/2024)   Social Connection  and Isolation Panel    Frequency of Communication with Friends and Family: More than three times a week    Frequency of Social Gatherings with Friends and Family: Once a week    Attends Religious Services: 1 to 4 times per year    Active Member of [employer] or Organizations: No    Attends Engineer, Structural: Not on file    Marital Status: Living with partner  Intimate Partner Violence: Not At Risk (03/14/2023)   Humiliation, Afraid, Rape, and Kick questionnaire    Fear of Current or Ex-Partner: No    Emotionally Abused: No    Physically Abused: No    Sexually Abused: No  Depression (PHQ2-9): Low Risk (02/05/2024)   Depression (PHQ2-9)    PHQ-2 Score: 3  Alcohol Screen: Low Risk (02/01/2024)   Alcohol Screen    Last Alcohol Screening Score (AUDIT): 1  Housing: Low Risk (02/01/2024)   Epic    Unable to Pay for Housing in the Last Year: No    Number of Times Moved in the Last Year: 0    Homeless in the Last Year: No  Utilities: Not At Risk (03/14/2023)   [facility] Utilities    Threatened with loss of utilities: No  Health Literacy: Not on file    Review of Systems  Respiratory:  Positive for apnea and shortness of breath.   Psychiatric/Behavioral:  Positive for sleep disturbance.     Vitals:   03/19/24 0831  BP: 114/79  Pulse: 81  SpO2: 99%     Physical Exam Constitutional:      Appearance: Normal appearance.  HENT:     Head: Normocephalic.     Nose: No congestion.     Mouth/Throat:     Mouth: Mucous membranes are moist.  Eyes:      General: No scleral icterus.    Pupils: Pupils are equal, round, and reactive to light.  Cardiovascular:     Rate and Rhythm: Normal rate and regular rhythm.     Heart sounds: No murmur heard.    No friction rub.  Pulmonary:     Effort: No respiratory distress.     Breath sounds: No stridor. No wheezing or rhonchi.  Musculoskeletal:     Cervical back: No rigidity or tenderness.  Skin:    General: Skin is warm.  Neurological:     General: No focal deficit present.     Mental Status: She is alert.  Psychiatric:        Mood and Affect: Mood normal.    Epworth Sleepiness Scale of 2  Data Reviewed: Last CT scan of the chest 08/08/2019 reviewed showing a 6 mm lung nodule -Film reviewed Assessment and Plan Assessment & Plan Pulmonary nodule A 6 mm pulmonary nodule identified in 2021 requires follow-up due to the time elapsed since the last CT scan. The nodule's stability over time is crucial for determining malignancy risk. If unchanged, malignancy is unlikely. Growth or change necessitates further evaluation, including a PET scan to assess activity. Surgical removal may be considered if growth is significant or activity is detected. Benign nodules rarely increase in size, but carcinoid tumors, which are slow-growing, can. - Ordered CT scan to evaluate the pulmonary nodule. - If the nodule has changed, will consider PET scan to assess activity. - If PET scan shows activity, will discuss potential surgical removal.  Suspected obstructive sleep apnea Symptoms include snoring and observed apneas during sleep. A home sleep study is planned to assess the frequency  and severity of apneic events. Risks of untreated sleep apnea include daytime fatigue, memory issues, hypertension, irregular heartbeats, stroke, and heart failure. Treatment options include CPAP, dental appliances, and surgery, depending on severity and patient preference. CPAP is the first-line treatment, with a 90%  effectiveness rate. Dental appliances are suitable for mild cases, and surgery is considered in severe cases with a 95% effectiveness rate for jaw advancement surgery. - Ordered home sleep study to assess for obstructive sleep apnea. - If sleep study indicates significant apnea, will discuss treatment options including CPAP, dental appliances, or surgery.  Chronic insomnia Managed with Ambien , used intermittently. Concerns about dependency and side effects, such as feeling under the influence upon waking, were discussed. Cognitive behavioral therapy for insomnia (CBT-I) was recommended as a long-term solution. Ambien  is effective for short-term use but should not be relied upon long-term due to potential side effects and dependency. - Continue Ambien  as needed for insomnia. - Recommended cognitive behavioral therapy for insomnia (CBT-I) as a long-term management strategy.     Orders Placed This Encounter  Procedures   CT Chest Wo Contrast    [facility] [address] [facility]/No Needs /No spinal cord/No body injector/no glucose mon/no heart monitor/NO PORT /ab w/[facility]  Please remember if you need to cancel your appt, please do so 24 hours prior to your appointment to avoid getting charged a no-show fee of $75.00 pt is aware/pt verbal understood instructions given    Standing Status:   Future    Expiration Date:   03/19/2025    Is patient pregnant?:   No    Preferred imaging location?:   [facility]   Home sleep test    Standing Status:   Future    Expiration Date:   03/19/2025    Where should this test be performed::   [facility]    Follow-up in about 3 months  Encouraged to call with significant concerns  Jennet Epley MD [facility] 03/19/2024, 7:57 PM  CC: Johnny Garnette LABOR, MD   "

## 2024-03-19 NOTE — Patient Instructions (Signed)
 We will schedule you for a CT scan of the chest to follow-up on the lung nodule  I will schedule you for a home sleep test -Will update you with results as soon as reviewed -Treatment options as discussed  Tentative follow-up in about 3 months  Ambien  for insomnia - Wean off as tolerated  Call/message us  with any significant concerns

## 2024-03-21 ENCOUNTER — Ambulatory Visit
Admission: RE | Admit: 2024-03-21 | Discharge: 2024-03-21 | Disposition: A | Discharge status: Home / Self Care | Source: Ambulatory Visit | Attending: Pulmonary Disease | Admitting: Pulmonary Disease

## 2024-03-21 DIAGNOSIS — R9389 Abnormal findings on diagnostic imaging of other specified body structures: Secondary | ICD-10-CM

## 2024-03-28 ENCOUNTER — Ambulatory Visit (INDEPENDENT_AMBULATORY_CARE_PROVIDER_SITE_OTHER)

## 2024-03-28 DIAGNOSIS — Z23 Encounter for immunization: Secondary | ICD-10-CM

## 2024-03-28 NOTE — Progress Notes (Signed)
 Patient is in office today for a nurse visit for Hepatitis B Immunization. Patient Injection was given in the  Right deltoid. Patient tolerated injection well.

## 2024-06-17 ENCOUNTER — Ambulatory Visit: Admitting: Pulmonary Disease

## 2024-08-12 ENCOUNTER — Inpatient Hospital Stay

## 2024-08-12 ENCOUNTER — Inpatient Hospital Stay: Admitting: Oncology
